# Patient Record
Sex: Female | Born: 1960 | Race: White | Hispanic: No | Marital: Married | Smoking: Never smoker
Health system: Southern US, Community
[De-identification: ages and names within clinical notes are randomized; demographics above are authoritative.]

## PROBLEM LIST (undated history)

## (undated) DIAGNOSIS — J45909 Unspecified asthma, uncomplicated: Secondary | ICD-10-CM

## (undated) DIAGNOSIS — I10 Essential (primary) hypertension: Secondary | ICD-10-CM

---

## 2006-01-03 ENCOUNTER — Ambulatory Visit: Payer: Self-pay

## 2007-01-07 ENCOUNTER — Ambulatory Visit: Payer: Self-pay | Admitting: Obstetrics and Gynecology

## 2007-01-21 ENCOUNTER — Ambulatory Visit: Payer: Self-pay | Admitting: Obstetrics and Gynecology

## 2008-08-30 ENCOUNTER — Inpatient Hospital Stay: Payer: Self-pay | Admitting: Internal Medicine

## 2011-02-19 ENCOUNTER — Ambulatory Visit: Payer: Self-pay | Admitting: Family Medicine

## 2012-09-30 ENCOUNTER — Ambulatory Visit: Payer: Self-pay | Admitting: Family Medicine

## 2014-11-23 ENCOUNTER — Ambulatory Visit: Payer: Self-pay | Admitting: Family Medicine

## 2014-12-12 ENCOUNTER — Ambulatory Visit: Payer: Self-pay | Admitting: Family Medicine

## 2015-05-23 ENCOUNTER — Emergency Department
Admission: EM | Admit: 2015-05-23 | Discharge: 2015-05-23 | Disposition: A | Discharge status: Home / Self Care | Payer: 59 | Attending: Emergency Medicine | Admitting: Emergency Medicine

## 2015-05-23 ENCOUNTER — Encounter: Payer: Self-pay | Admitting: Emergency Medicine

## 2015-05-23 ENCOUNTER — Emergency Department: Payer: 59

## 2015-05-23 DIAGNOSIS — D72829 Elevated white blood cell count, unspecified: Secondary | ICD-10-CM | POA: Diagnosis not present

## 2015-05-23 DIAGNOSIS — N12 Tubulo-interstitial nephritis, not specified as acute or chronic: Secondary | ICD-10-CM | POA: Diagnosis not present

## 2015-05-23 DIAGNOSIS — R109 Unspecified abdominal pain: Secondary | ICD-10-CM

## 2015-05-23 DIAGNOSIS — R799 Abnormal finding of blood chemistry, unspecified: Secondary | ICD-10-CM | POA: Diagnosis present

## 2015-05-23 HISTORY — DX: Unspecified asthma, uncomplicated: J45.909

## 2015-05-23 HISTORY — DX: Essential (primary) hypertension: I10

## 2015-05-23 MED ORDER — IOHEXOL 240 MG/ML SOLN
25.0000 mL | Freq: Once | INTRAMUSCULAR | Status: AC | PRN
  Administered 2015-05-23: 25 mL via ORAL

## 2015-05-23 MED ORDER — IOHEXOL 300 MG/ML  SOLN
100.0000 mL | Freq: Once | INTRAMUSCULAR | Status: AC | PRN
  Administered 2015-05-23: 100 mL via INTRAVENOUS

## 2015-05-23 MED ORDER — CEPHALEXIN 500 MG PO CAPS
500.0000 mg | ORAL_CAPSULE | Freq: Four times a day (QID) | ORAL | Status: AC
Start: 2015-05-23 — End: 2015-06-02

## 2015-05-23 MED ORDER — POTASSIUM CHLORIDE CRYS ER 20 MEQ PO TBCR
40.0000 meq | EXTENDED_RELEASE_TABLET | Freq: Once | ORAL | Status: AC
  Administered 2015-05-23: 40 meq via ORAL
  Filled 2015-05-23: qty 2

## 2015-05-23 MED ORDER — DEXTROSE 5 % IV SOLN
1.0000 g | Freq: Once | INTRAVENOUS | Status: AC
  Administered 2015-05-23: 1 g via INTRAVENOUS
  Filled 2015-05-23: qty 10

## 2015-05-23 MED ORDER — SODIUM CHLORIDE 0.9 % IV BOLUS (SEPSIS)
1000.0000 mL | Freq: Once | INTRAVENOUS | Status: AC
  Administered 2015-05-23: 1000 mL via INTRAVENOUS

## 2015-05-23 NOTE — ED Provider Notes (Signed)
[facility] Emergency Department Provider Note    ____________________________________________  Time seen: 1430  I have reviewed the triage vital signs and the nursing notes.   HISTORY  Chief Complaint Abnormal Lab   History limited by: Not Limited   HPI Maria Poole is a 54 y.o. female who presents to the emergency department today because of concern for worsening urinary tract infection. The patient states that she has been having suprapubic pain for the past 3-4 days. She started on ciprofloxacin 2 days ago for a urinary tract infection. When she went back to clinic today on recheck they noticed that her potassium was lower and still have concern for urinary tract infection. Additionally the patient states that her symptoms of fever or nausea and fatigue have worsened.     No past medical history on file.  There are no active problems to display for this patient.   No past surgical history on file.  No current outpatient prescriptions on file.  Allergies Review of patient's allergies indicates no known allergies.  No family history on file.  Social History History  Substance Use Topics  . Smoking status: Never Smoker   . Smokeless tobacco: Not on file  . Alcohol Use: No    Review of Systems  Constitutional: Positive for fever Cardiovascular: Negative for chest pain. Respiratory: Negative for shortness of breath. Gastrointestinal: Positive for lower abdominal pain and nausea Genitourinary: Increased difficulty with urination Musculoskeletal: Positive for low back pain Skin: Negative for rash. Neurological: Negative for headaches, focal weakness or numbness.  10-point ROS otherwise negative.  ____________________________________________   PHYSICAL EXAM:  VITAL SIGNS: ED Triage Vitals  Enc Vitals Group     BP 05/23/15 1309 115/71 mmHg     Pulse Rate 05/23/15 1309 79     Resp 05/23/15 1309 18     Temp 05/23/15 1309  98.7 F (37.1 C)     Temp Source 05/23/15 1309 Oral     SpO2 05/23/15 1309 96 %     Weight 05/23/15 1309 188 lb (85.276 kg)     Height 05/23/15 1309 5\' 5"  (1.651 m)     Head Cir --      Peak Flow --      Pain Score 05/23/15 1424 4   Constitutional: Alert and oriented. Well appearing and in no distress. Eyes: Conjunctivae are normal. PERRL. Normal extraocular movements. ENT   Head: Normocephalic and atraumatic.   Nose: No congestion/rhinnorhea.   Mouth/Throat: Mucous membranes are moist.   Neck: No stridor. Hematological/Lymphatic/Immunilogical: No cervical lymphadenopathy. Cardiovascular: Normal rate, regular rhythm.  No murmurs, rubs, or gallops. Respiratory: Normal respiratory effort without tachypnea nor retractions. Breath sounds are clear and equal bilaterally. No wheezes/rales/rhonchi. Gastrointestinal: Soft, tender to palpation in the lower abdomen. No rebound no guarding. No distention. There is no CVA tenderness. Genitourinary: Deferred Musculoskeletal: Normal range of motion in all extremities. No joint effusions.  No lower extremity tenderness nor edema. Neurologic:  Normal speech and language. No gross focal neurologic deficits are appreciated. Speech is normal.  Skin:  Skin is warm, dry and intact. No rash noted. Psychiatric: Mood and affect are normal. Speech and behavior are normal. Patient exhibits appropriate insight and judgment.  ____________________________________________    LABS (pertinent positives/negatives)  Labs reviewed from [facility] clinic ____________________________________________   EKG  None  ____________________________________________    RADIOLOGY  CT abd/pel pending at time of signout  ____________________________________________   PROCEDURES  Procedure(s) performed: None  Critical Care performed: No  ____________________________________________   INITIAL IMPRESSION / ASSESSMENT AND PLAN / ED COURSE  Pertinent  labs & imaging results that were available during my care of the patient were reviewed by me and considered in my medical decision making (see chart for details).  Patient presents to the emergency department today with concerns for worsening urinary tract infection. Blood work from [facility] note a clinic shows a significant leukocytosis. Patient is tender in the lower abdomen. No CVA tenderness for me so at this point I doubt pyelonephritis. Will have her proceed with a CT abdomen and pelvis to for intra-abdominal pathology.  ____________________________________________   FINAL CLINICAL IMPRESSION(S) / ED DIAGNOSES  Abdominal pain Leukocytosis  Phineas Semen, MD 05/23/15 1447

## 2015-05-23 NOTE — ED Provider Notes (Signed)
CT returned showing hypodensities in both kidneys. There is no other pathology noted. This is most consistent with bilateral pyelonephritis. Patient is not vomiting does not have a fever at this time. She does have a very high white count and white cells in the urine. Per discussion with Dr. Derrill Kay will try Keflex by mouth 504 times a day. Told patient and her husband A not to go to work B return if she becomes worse high fever or worse pain and vomiting and unable to keep down the medicine or feeling sicker C she is to follow-up within 2 days either here or with her clinic to make sure she is getting better.  Arnaldo Natal, MD 05/23/15 [phone]

## 2015-05-23 NOTE — ED Notes (Signed)
Patient to ED from [facility] with report of worsening kidney infection. Patient completed antibiotics but continues to have worsening symptoms as well as lab values. Patient came with lab results that were completed today. Patient c/o lower abdominal pain.

## 2015-05-25 LAB — URINE CULTURE
CULTURE: NO GROWTH
Special Requests: NORMAL

## 2016-07-12 IMAGING — CT CT ABD-PELV W/ CM
1 of 3 series · 14 of 32 positions shown, 19 images · IV contrast (omnipaque)
Comparison: Report from 08/30/2008 CT.

CLINICAL DATA: 54-year-old female with worsening kidney infection.
Lower abdominal pain for the past week. Prior appendectomy. History
of hypertension. Initial encounter.

EXAM:
CT ABDOMEN AND PELVIS WITH CONTRAST
TECHNIQUE: Multidetector CT imaging of the abdomen and pelvis was performed
using the standard protocol following bolus administration of
intravenous contrast.
CONTRAST:  100mL OMNIPAQUE IOHEXOL 300 MG/ML  SOLN

[Series 2: routine abd pel with · axial · 0.80mm/px · z∈[-403,+7]mm · 14 of 92 slices shown, 19 images]
[im 5/92  soft-tissue]
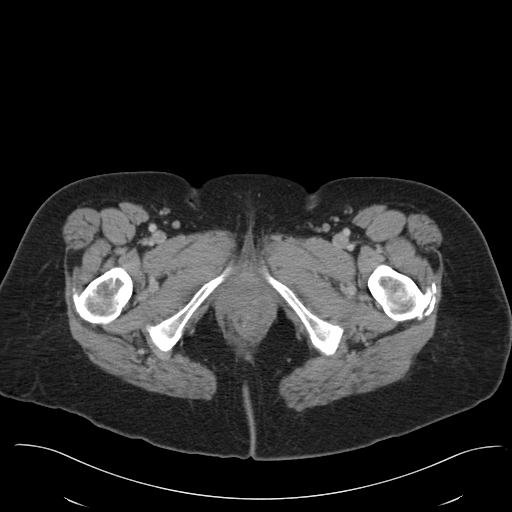
[im 5/92  bone]
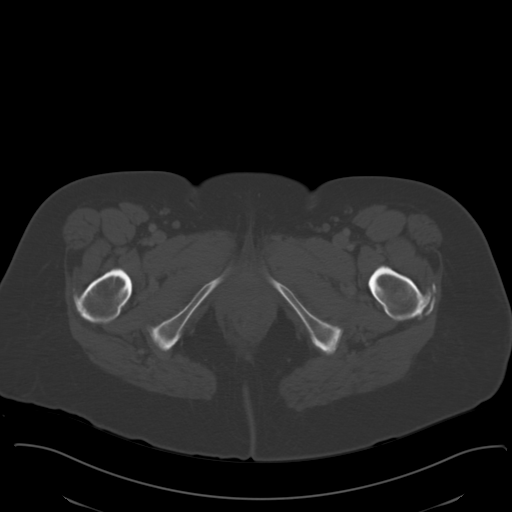
[im 15/92  soft-tissue]
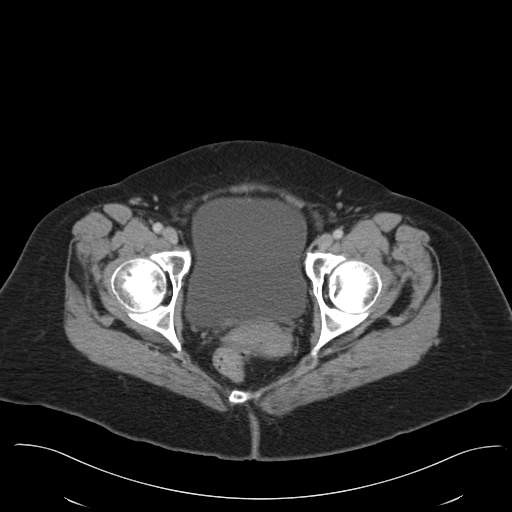
[im 20/92  soft-tissue]
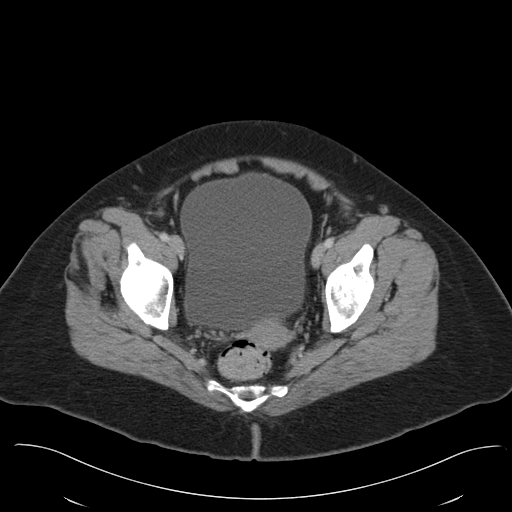
[im 24/92  soft-tissue]
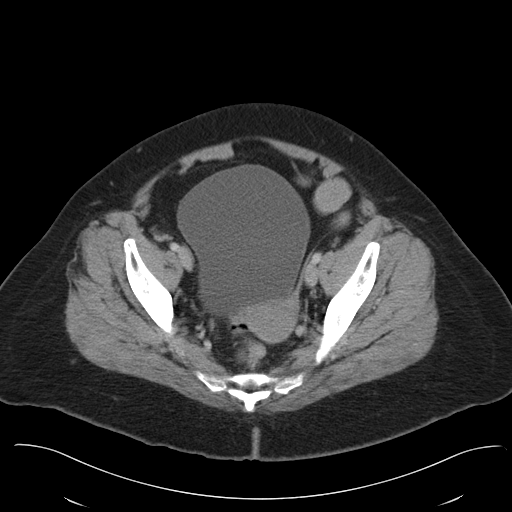
[im 34/92  soft-tissue]
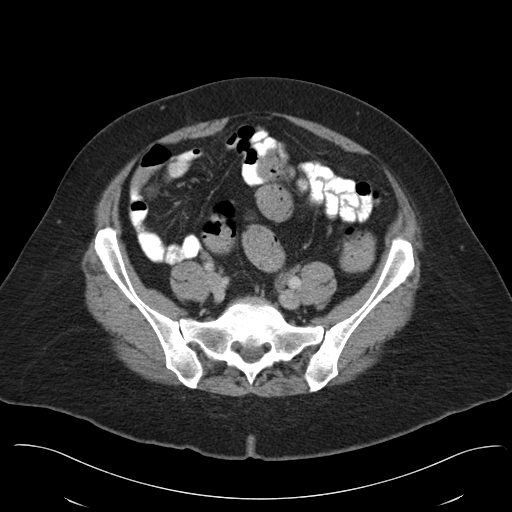
[im 39/92  soft-tissue]
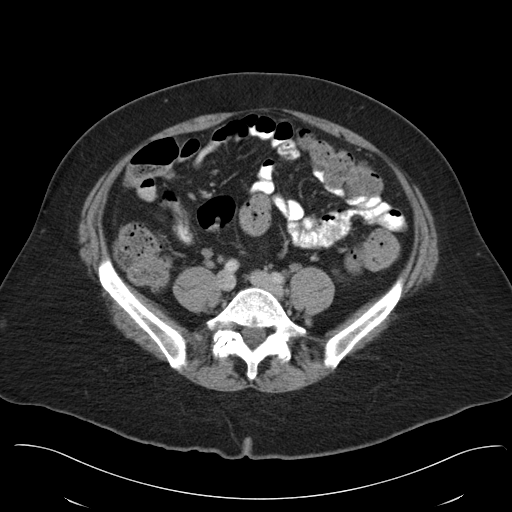
[im 48/92  soft-tissue]
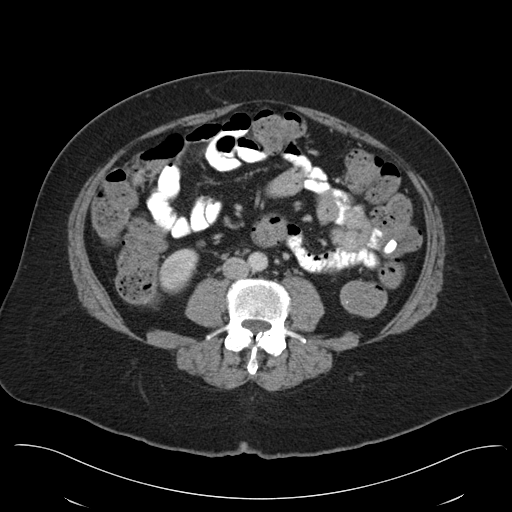
[im 53/92  soft-tissue]
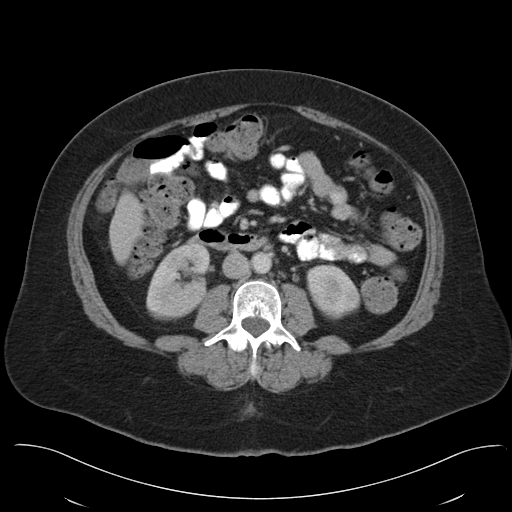
[im 58/92  soft-tissue]
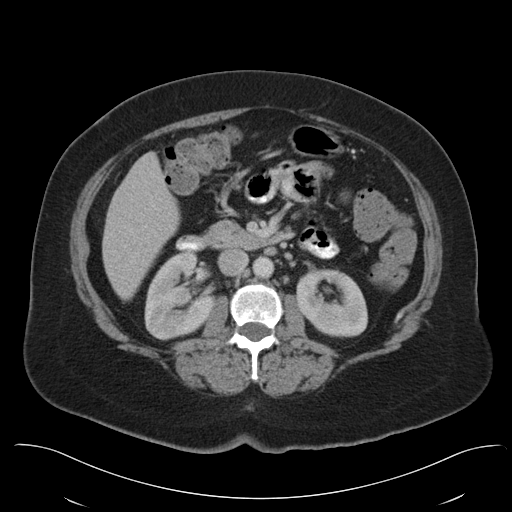
[im 58/92  bone]
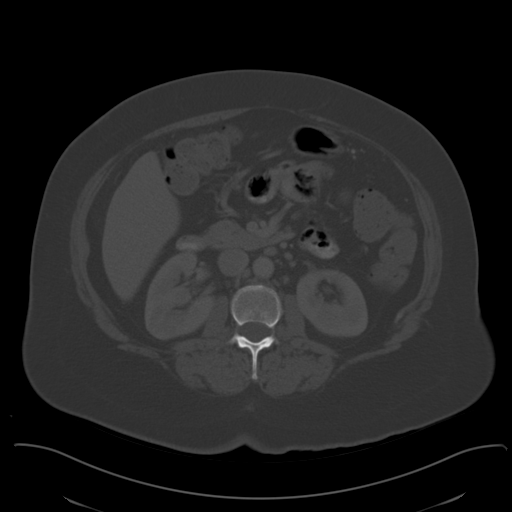
[im 68/92  soft-tissue]
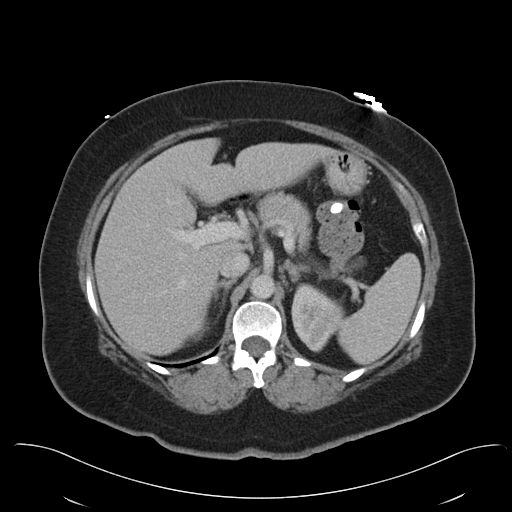
[im 72/92  soft-tissue]
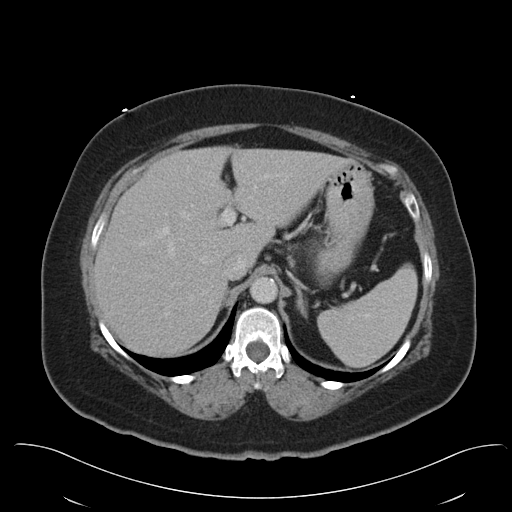
[im 72/92  lung]
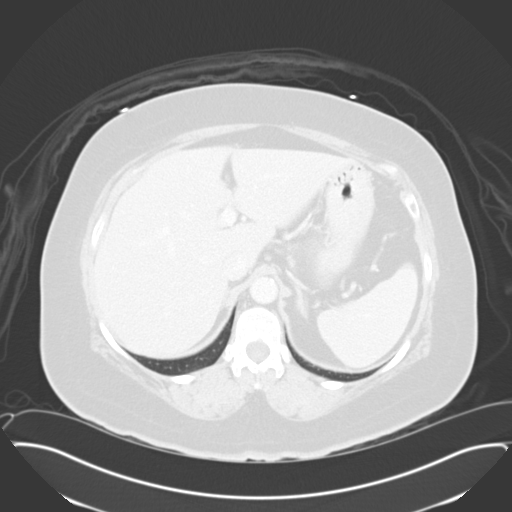
[im 77/92  soft-tissue]
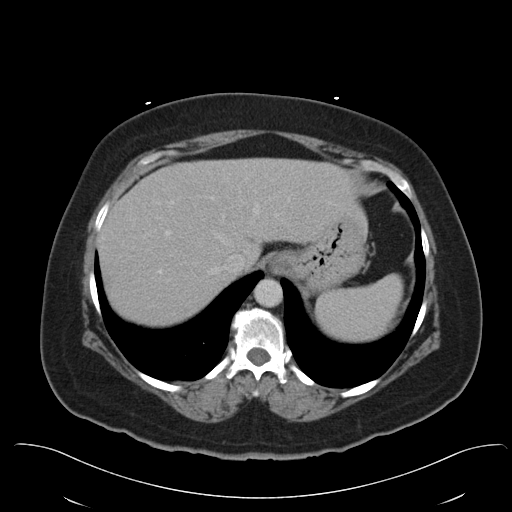
[im 77/92  lung]
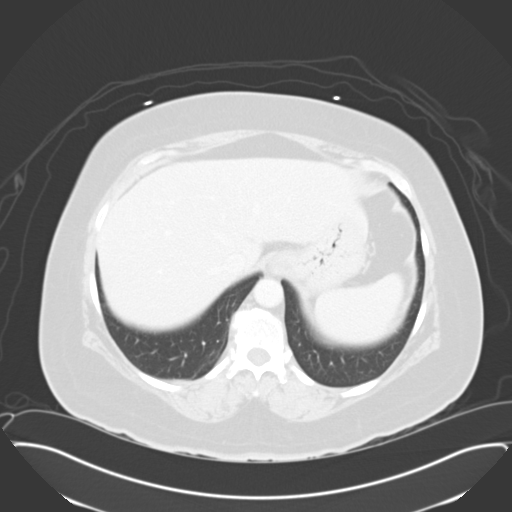
[im 82/92  lung]
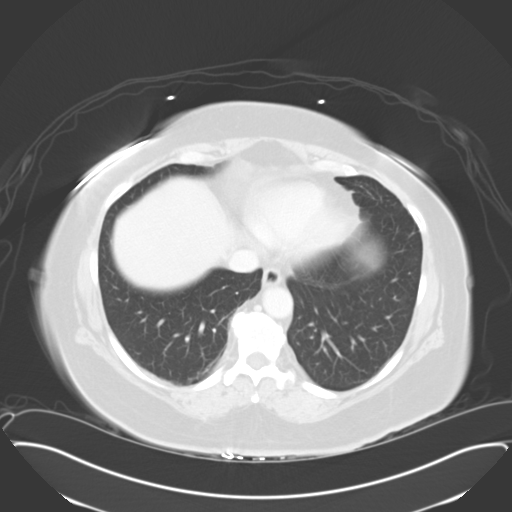
[im 87/92  soft-tissue]
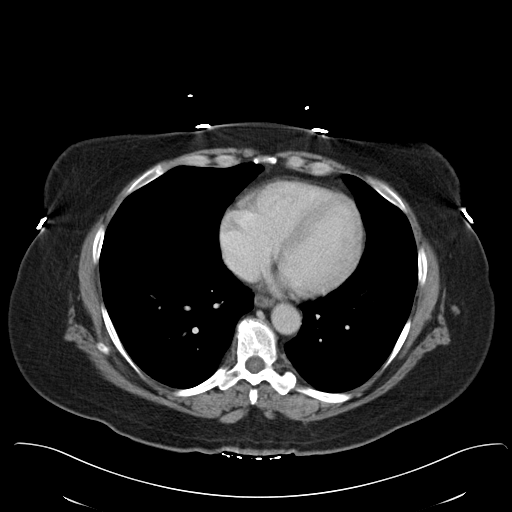
[im 87/92  lung]
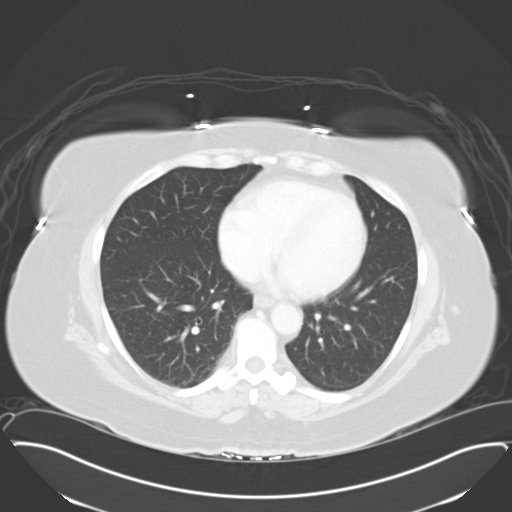

[14 of 32 positions shown; findings below may reference images not displayed]

FINDINGS: Lung bases clear.

Prominent bilateral low-density regions throughout the kidneys
greater on the right consistent with patient's history of
pyelonephritis currently without well-defined drainable abscess. No
hydronephrosis.

Increased number of normal to top-normal size retroperitoneal lymph
nodes may be reactive in origin.

No extra luminal bowel inflammatory process, free fluid or free air.

No worrisome hepatic, splenic, adrenal or pancreatic lesion. No
calcified gallstones.

Trace calcification abdominal aorta.  No abdominal aortic aneurysm.

Urinary bladder significantly enlarged without gross abnormality
noted.

Uterus and ovaries without gross abnormality.

Mild degenerative changes lumbar spine. No worrisome osseous lesion.
IMPRESSION: Prominent bilateral low-density regions throughout the kidneys
greater on the right consistent with patient's history of
pyelonephritis. Currently without well-defined drainable abscess. No
hydronephrosis.

Increased number of normal to top-normal size retroperitoneal lymph
nodes may be reactive in origin.

## 2018-11-13 ENCOUNTER — Other Ambulatory Visit: Payer: Self-pay | Admitting: Podiatry

## 2018-11-13 ENCOUNTER — Ambulatory Visit (INDEPENDENT_AMBULATORY_CARE_PROVIDER_SITE_OTHER): Payer: BC Managed Care – PPO

## 2018-11-13 ENCOUNTER — Ambulatory Visit: Payer: BC Managed Care – PPO | Admitting: Podiatry

## 2018-11-13 ENCOUNTER — Encounter: Payer: Self-pay | Admitting: Podiatry

## 2018-11-13 VITALS — BP 139/78 | HR 71

## 2018-11-13 DIAGNOSIS — F32A Depression, unspecified: Secondary | ICD-10-CM | POA: Insufficient documentation

## 2018-11-13 DIAGNOSIS — F329 Major depressive disorder, single episode, unspecified: Secondary | ICD-10-CM | POA: Insufficient documentation

## 2018-11-13 DIAGNOSIS — M21612 Bunion of left foot: Secondary | ICD-10-CM

## 2018-11-13 DIAGNOSIS — M76821 Posterior tibial tendinitis, right leg: Secondary | ICD-10-CM | POA: Diagnosis not present

## 2018-11-13 DIAGNOSIS — M2042 Other hammer toe(s) (acquired), left foot: Secondary | ICD-10-CM

## 2018-11-13 DIAGNOSIS — M21611 Bunion of right foot: Secondary | ICD-10-CM

## 2018-11-13 DIAGNOSIS — J45909 Unspecified asthma, uncomplicated: Secondary | ICD-10-CM | POA: Insufficient documentation

## 2018-11-13 DIAGNOSIS — M2041 Other hammer toe(s) (acquired), right foot: Secondary | ICD-10-CM

## 2018-11-13 DIAGNOSIS — M19079 Primary osteoarthritis, unspecified ankle and foot: Secondary | ICD-10-CM

## 2018-11-13 DIAGNOSIS — M2142 Flat foot [pes planus] (acquired), left foot: Secondary | ICD-10-CM

## 2018-11-13 DIAGNOSIS — M2141 Flat foot [pes planus] (acquired), right foot: Secondary | ICD-10-CM

## 2018-11-13 MED ORDER — CELECOXIB 200 MG PO CAPS: 200.0000 mg | ORAL_CAPSULE | Freq: Two times a day (BID) | ORAL | 2 refills | Status: DC

## 2018-11-17 NOTE — Progress Notes (Signed)
   HPI: 58 year old female presents the office today for bilateral foot pain.  Patient states that her feet hurt all of the time.  She has orthotics she may need new months.  She complains of diffuse foot and ankle pain along the inside and outside the foot.  She has sharp shooting stabbing pains in both of her feet and sides of the ankles.  She has been using tea tree oil on her nails but she is concerned about fungus.  Patient is currently taking meloxicam prescribed by another physician daily but there is been no improvement of the patient's symptoms.  There is been no improvement of the patient's symptoms.  Past Medical History:  Diagnosis Date  . Asthma   . Hypertension      Physical Exam: General: The patient is alert and oriented x3 in no acute distress.  Dermatology: Skin is warm, dry and supple bilateral lower extremities. Negative for open lesions or macerations.  Vascular: Palpable pedal pulses bilaterally. No edema or erythema noted. Capillary refill within normal limits.  Neurological: Epicritic and protective threshold grossly intact bilaterally.   Musculoskeletal Exam: Range of motion within normal limits to all pedal and ankle joints bilateral. Muscle strength 5/5 in all groups bilateral.  Pain on palpation along the posterior tibial tendon insertion right lower extremity.  Clinical evidence of bunion hammertoe deformities bilateral.  There is also some pain on palpation diffusely throughout the foot and ankle bilateral  Radiographic Exam:  Normal osseous mineralization.  There is some diffuse degenerative changes and joint space narrowing within the pedal joints of the feet bilateral.  Increased intermetatarsal angle greater than 15 degrees of the hallux abductus angle greater than 30 degrees consistent with bunions bilateral.  Hammertoe contracture also noted to digits 2-5 bilateral.  Collapse of the calcaneal inclination angle and metatarsal declination angle noted consistent  with a pes planus bilateral.  Assessment: 1.  Insertional posterior tibial tendinitis right 2.  Hallux abductovalgus bilateral 3.  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  Pes planus bilateral 5.  DJD bilateral feet   Plan of Care:  1. Patient evaluated. X-Rays reviewed.  2.  Injection of 0.5 cc Celestone Soluspan injected into the insertional sheath of the posterior tibial tendon right 3.  Discontinue meloxicam.  Prescription for Celebrex 200 mg twice daily 4.  Appointment made today with Raiford Noble, Pedorthist, for custom molded insoles 5.  Recommend good supportive sneakers 6.  Return to clinic in 8 weeks  *[place] [facility]      Felecia Shelling, DPM [facility]  Dr. Felecia Shelling, DPM    [address] [place], [place] [address]                Office [phone]  Fax [phone]

## 2018-11-25 ENCOUNTER — Ambulatory Visit (INDEPENDENT_AMBULATORY_CARE_PROVIDER_SITE_OTHER): Payer: BC Managed Care – PPO | Admitting: Orthotics

## 2018-11-25 DIAGNOSIS — M2141 Flat foot [pes planus] (acquired), right foot: Secondary | ICD-10-CM | POA: Diagnosis not present

## 2018-11-25 DIAGNOSIS — M2142 Flat foot [pes planus] (acquired), left foot: Secondary | ICD-10-CM

## 2018-11-25 DIAGNOSIS — M21611 Bunion of right foot: Secondary | ICD-10-CM

## 2018-11-25 DIAGNOSIS — M21612 Bunion of left foot: Secondary | ICD-10-CM

## 2018-11-25 DIAGNOSIS — M76821 Posterior tibial tendinitis, right leg: Secondary | ICD-10-CM

## 2018-11-25 NOTE — Progress Notes (Signed)
Patient presents today with a hx of PTTD/AAF.  Upon assessment, patient has pronounced pes planus w/ a valgus RF deformity.  Patient has medially shifted talus/navicular.  Goal is provide longitudinal arch support and RF stability.  Plan on deep heel cup, hug arch, wide foot orthosis w/ medial flange and varus correction for RF valgus deformity.  Patient educated in the progessive nature of PTTD and financial responsibility.  

## 2018-12-16 ENCOUNTER — Ambulatory Visit: Payer: BC Managed Care – PPO | Admitting: Orthotics

## 2018-12-16 DIAGNOSIS — M21612 Bunion of left foot: Secondary | ICD-10-CM

## 2018-12-16 DIAGNOSIS — M21611 Bunion of right foot: Secondary | ICD-10-CM

## 2018-12-16 DIAGNOSIS — M76821 Posterior tibial tendinitis, right leg: Secondary | ICD-10-CM

## 2018-12-16 DIAGNOSIS — M2142 Flat foot [pes planus] (acquired), left foot: Secondary | ICD-10-CM

## 2018-12-16 DIAGNOSIS — M2141 Flat foot [pes planus] (acquired), right foot: Secondary | ICD-10-CM

## 2018-12-18 NOTE — Progress Notes (Signed)
Patient came in today to pick up custom made foot orthotics.  The goals were accomplished and the patient reported no dissatisfaction with said orthotics.  Patient was advised of breakin period and how to report any issues. 

## 2019-01-08 ENCOUNTER — Ambulatory Visit: Payer: BC Managed Care – PPO | Admitting: Podiatry

## 2019-01-08 ENCOUNTER — Other Ambulatory Visit: Payer: Self-pay

## 2019-02-22 ENCOUNTER — Other Ambulatory Visit: Payer: Self-pay

## 2019-02-22 MED ORDER — CELECOXIB 200 MG PO CAPS: 200.0000 mg | ORAL_CAPSULE | Freq: Two times a day (BID) | ORAL | 2 refills | Status: DC

## 2019-02-22 NOTE — Telephone Encounter (Signed)
Pharmacy refill request for Celebrex 200mg    Per Dr. Logan Bores verbal order, ok to refill.   Script has been sent to pharmacy.

## 2019-04-27 ENCOUNTER — Encounter: Payer: Self-pay | Admitting: Podiatry

## 2019-04-27 ENCOUNTER — Other Ambulatory Visit: Payer: Self-pay

## 2019-04-27 ENCOUNTER — Ambulatory Visit: Payer: BC Managed Care – PPO | Admitting: Podiatry

## 2019-04-27 VITALS — Temp 98.0°F

## 2019-04-27 DIAGNOSIS — M21611 Bunion of right foot: Secondary | ICD-10-CM | POA: Diagnosis not present

## 2019-04-27 DIAGNOSIS — M21612 Bunion of left foot: Secondary | ICD-10-CM

## 2019-04-27 DIAGNOSIS — M76821 Posterior tibial tendinitis, right leg: Secondary | ICD-10-CM

## 2019-04-27 DIAGNOSIS — M2142 Flat foot [pes planus] (acquired), left foot: Secondary | ICD-10-CM

## 2019-04-27 DIAGNOSIS — M2042 Other hammer toe(s) (acquired), left foot: Secondary | ICD-10-CM

## 2019-04-27 DIAGNOSIS — M2141 Flat foot [pes planus] (acquired), right foot: Secondary | ICD-10-CM | POA: Diagnosis not present

## 2019-04-27 DIAGNOSIS — M19079 Primary osteoarthritis, unspecified ankle and foot: Secondary | ICD-10-CM

## 2019-04-27 DIAGNOSIS — M2041 Other hammer toe(s) (acquired), right foot: Secondary | ICD-10-CM

## 2019-04-27 MED ORDER — METHYLPREDNISOLONE 4 MG PO TBPK: ORAL_TABLET | ORAL | 0 refills | Status: DC

## 2019-04-28 NOTE — Progress Notes (Signed)
   HPI: 58 year old female presents the office today for follow up evaluation of bilateral foot pain. She reports the pain from the PT tendinitis of the right foot flares up intermittently. Being on her feet for long periods of time increases the pain. She states the pain radiates up her leg when she stands for too long. She has been taking Celebrex for pain. Patient is here for further evaluation and treatment.   Past Medical History:  Diagnosis Date  . Asthma   . Hypertension      Physical Exam: General: The patient is alert and oriented x3 in no acute distress.  Dermatology: Skin is warm, dry and supple bilateral lower extremities. Negative for open lesions or macerations.  Vascular: Palpable pedal pulses bilaterally. No edema or erythema noted. Capillary refill within normal limits.  Neurological: Epicritic and protective threshold grossly intact bilaterally.   Musculoskeletal Exam: Range of motion within normal limits to all pedal and ankle joints bilateral. Muscle strength 5/5 in all groups bilateral.  Pain on palpation along the posterior tibial tendon insertion right lower extremity.  Clinical evidence of bunion hammertoe deformities bilateral.  There is also some pain on palpation diffusely throughout the foot and ankle bilateral  Assessment: 1.  Insertional posterior tibial tendinitis right 2.  Hallux abductovalgus bilateral 3.  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  Pes planus bilateral 5.  DJD bilateral feet   Plan of Care:  1. Patient evaluated.  2.  Injection of 0.5 cc Celestone Soluspan injected into the insertion of the posterior tibial tendon sheath of the right lower extremity.  3.  Prescription for Medrol Dose Pak provided to patient. Then continue taking Celebrex 200 mg twice daily.  4. Continue using custom orthotics.  5. Return to clinic as needed.   Artist at [place] M. Evans, DPM [facility]  Dr. Edrick Kins, DPM    [address], [place] [address]                Office [phone]  Fax [phone]

## 2019-05-18 ENCOUNTER — Other Ambulatory Visit: Payer: Self-pay | Admitting: Podiatry

## 2019-05-18 ENCOUNTER — Other Ambulatory Visit: Payer: Self-pay

## 2019-06-25 ENCOUNTER — Encounter: Payer: Self-pay | Admitting: Podiatry

## 2019-06-25 ENCOUNTER — Ambulatory Visit: Payer: BC Managed Care – PPO | Admitting: Podiatry

## 2019-06-25 ENCOUNTER — Other Ambulatory Visit: Payer: Self-pay

## 2019-06-25 DIAGNOSIS — M2141 Flat foot [pes planus] (acquired), right foot: Secondary | ICD-10-CM

## 2019-06-25 DIAGNOSIS — M76821 Posterior tibial tendinitis, right leg: Secondary | ICD-10-CM

## 2019-06-25 DIAGNOSIS — M2142 Flat foot [pes planus] (acquired), left foot: Secondary | ICD-10-CM

## 2019-06-25 MED ORDER — METHYLPREDNISOLONE 4 MG PO TBPK: ORAL_TABLET | ORAL | 0 refills | Status: AC

## 2019-06-28 NOTE — Progress Notes (Signed)
   HPI: 58 year old female presents the office today for follow up evaluation of bilateral foot pain. She reports continued pain and swelling in the right ankle and states it is worsening. She states the injection she had at the last appointment did not provided any relief. She has been taking Celebrex for treatment. Patient is here for further evaluation and treatment.   Past Medical History:  Diagnosis Date  . Asthma   . Hypertension      Physical Exam: General: The patient is alert and oriented x3 in no acute distress.  Dermatology: Skin is warm, dry and supple bilateral lower extremities. Negative for open lesions or macerations.  Vascular: Palpable pedal pulses bilaterally. No edema or erythema noted. Capillary refill within normal limits.  Neurological: Epicritic and protective threshold grossly intact bilaterally.   Musculoskeletal Exam: Range of motion within normal limits to all pedal and ankle joints bilateral. Muscle strength 5/5 in all groups bilateral.   Pain on palpation noted to the posterior tibial tendon of the right foot with medial longitudinal arch collapse noted. Clinical evidence of bunion hammertoe deformities bilateral.   There is also some pain on palpation diffusely throughout the foot and ankle bilateral  Assessment: 1. PTTD right  2. Hallux abductovalgus bilateral 3. 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 Pes planus bilateral 5. DJD bilateral feet   Plan of Care:  1. Patient evaluated.  2. Injection of 0.5 cc Celestone Soluspan injected into the insertion of the posterior tibial tendon sheath of the right lower extremity.  3. Prescription for Medrol Dose Pak provided to patient. Then continue taking Celebrex 200 mg.  4. Ankle brace dispensed.  5. Return to clinic in 4 weeks.    Artist at [place] M. Aarini Slee, DPM [facility]  Dr. Edrick Kins, DPM    [address], [place]  [address]                Office [phone]  Fax [phone]

## 2019-07-27 ENCOUNTER — Ambulatory Visit: Payer: BC Managed Care – PPO | Admitting: Podiatry

## 2019-07-27 ENCOUNTER — Other Ambulatory Visit: Payer: Self-pay

## 2019-07-27 DIAGNOSIS — M2141 Flat foot [pes planus] (acquired), right foot: Secondary | ICD-10-CM

## 2019-07-27 DIAGNOSIS — M2142 Flat foot [pes planus] (acquired), left foot: Secondary | ICD-10-CM

## 2019-07-27 DIAGNOSIS — M76821 Posterior tibial tendinitis, right leg: Secondary | ICD-10-CM

## 2019-07-27 NOTE — Progress Notes (Signed)
   HPI: 58 year old female presents the office today for follow up evaluation of bilateral foot pain. She states she shows some improvement. She has been using the ankle braces which have helped ease her pain. Taking the Celebrex has also helped alleviate the pain. There are no specific worsening factors noted. Patient is here for further evaluation and treatment.   Past Medical History:  Diagnosis Date  . Asthma   . Hypertension      Physical Exam: General: The patient is alert and oriented x3 in no acute distress.  Dermatology: Skin is warm, dry and supple bilateral lower extremities. Negative for open lesions or macerations.  Vascular: Palpable pedal pulses bilaterally. No edema or erythema noted. Capillary refill within normal limits.  Neurological: Epicritic and protective threshold grossly intact bilaterally.   Musculoskeletal Exam: Range of motion within normal limits to all pedal and ankle joints bilateral. Muscle strength 5/5 in all groups bilateral.   Pain on palpation noted to the posterior tibial tendon of the right foot with medial longitudinal arch collapse noted. Clinical evidence of bunion hammertoe deformities bilateral.   There is also some pain on palpation diffusely throughout the foot and ankle bilateral  Assessment: 1. PTTD right  2. Hallux abductovalgus bilateral 3. 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 Pes planus bilateral 5. DJD bilateral feet   Plan of Care:  1. Patient evaluated.  2. Injection of 0.5 cc Celestone Soluspan injected into the insertion of the posterior tibial tendon sheath of the right lower extremity.  3. Continue taking Celebrex 200 mg BID.  4. Continue using custom orthotics.  5. Continue using OTC ankle brace.  6. Return to clinic in 4 weeks. If not better, we will order an MRI.    Artist at [place] M. Evans, DPM [facility]  Dr. Edrick Kins, DPM    [address], [place] [address]                Office [phone]  Fax [phone]

## 2019-08-24 ENCOUNTER — Encounter: Payer: Self-pay | Admitting: Podiatry

## 2019-08-24 ENCOUNTER — Ambulatory Visit: Payer: BC Managed Care – PPO | Admitting: Podiatry

## 2019-08-24 ENCOUNTER — Other Ambulatory Visit: Payer: Self-pay

## 2019-08-24 DIAGNOSIS — M2141 Flat foot [pes planus] (acquired), right foot: Secondary | ICD-10-CM

## 2019-08-24 DIAGNOSIS — M76821 Posterior tibial tendinitis, right leg: Secondary | ICD-10-CM | POA: Diagnosis not present

## 2019-08-24 DIAGNOSIS — M2142 Flat foot [pes planus] (acquired), left foot: Secondary | ICD-10-CM

## 2019-08-26 NOTE — Progress Notes (Signed)
   HPI: 58 year old female presents the office today for follow up evaluation of bilateral foot pain. She states her pain in the right foot has worsened. She has been taking Ibuprofen with no significant relief. Walking and being on the foot increases the pain. Patient is here for further evaluation and treatment.   Past Medical History:  Diagnosis Date  . Asthma   . Hypertension      Physical Exam: General: The patient is alert and oriented x3 in no acute distress.  Dermatology: Skin is warm, dry and supple bilateral lower extremities. Negative for open lesions or macerations.  Vascular: Palpable pedal pulses bilaterally. No edema or erythema noted. Capillary refill within normal limits.  Neurological: Epicritic and protective threshold grossly intact bilaterally.   Musculoskeletal Exam: Range of motion within normal limits to all pedal and ankle joints bilateral. Muscle strength 5/5 in all groups bilateral.   Pain on palpation noted to the posterior tibial tendon of the right foot with medial longitudinal arch collapse noted.  Clinical evidence of bunion hammertoe deformities bilateral.   There is also some pain on palpation diffusely throughout the foot and ankle bilateral  Assessment: 1. PTTD right  2. Hallux abductovalgus bilateral 3. 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 Pes planus bilateral 5. DJD bilateral feet 6. PT tendinitis right    Plan of Care:  1. Patient evaluated.  2. Injection of 0.5 cc Celestone Soluspan injected into the insertion of the posterior tibial tendon sheath of the right lower extremity.  3. Continue taking Celebrex 200 mg BID.  4. MRI of the right ankle ordered.  5. CAM boot dispensed.  6. Return to clinic after MRI to review results.    Artist at [place] M. Carrol Bondar, DPM [facility]  Dr. Edrick Kins, DPM    [address], [place] [address]                Office [phone]  Fax [phone]

## 2019-08-30 ENCOUNTER — Telehealth: Payer: Self-pay

## 2019-08-30 DIAGNOSIS — M76821 Posterior tibial tendinitis, right leg: Secondary | ICD-10-CM

## 2019-08-30 NOTE — Telephone Encounter (Signed)
MRI has been approved from 08/30/2019 to 02/25/2020 Auth # [id number]  Patient has been notified of approval and will call scheduling to set up appt to her convenience

## 2019-08-30 NOTE — Telephone Encounter (Signed)
-----   Message from Edrick Kins, DPM sent at 08/24/2019 10:28 AM EDT ----- Regarding: MRI right ankle Please order MRI right ankle w/out contrast.   Dx: PTTD RT. Possible PT tendon tear RT  Thanks, Dr. Amalia Hailey

## 2019-09-09 ENCOUNTER — Ambulatory Visit: Payer: BC Managed Care – PPO

## 2019-09-17 ENCOUNTER — Ambulatory Visit: Payer: BC Managed Care – PPO | Admitting: Podiatry

## 2019-09-20 ENCOUNTER — Other Ambulatory Visit: Payer: Self-pay

## 2019-09-20 ENCOUNTER — Ambulatory Visit
Admission: RE | Admit: 2019-09-20 | Discharge: 2019-09-20 | Disposition: A | Discharge status: Home / Self Care | Payer: BC Managed Care – PPO | Source: Ambulatory Visit | Attending: Podiatry | Admitting: Podiatry

## 2019-09-20 DIAGNOSIS — M76821 Posterior tibial tendinitis, right leg: Secondary | ICD-10-CM | POA: Diagnosis present

## 2019-09-28 ENCOUNTER — Ambulatory Visit: Payer: BC Managed Care – PPO | Admitting: Podiatry

## 2019-09-28 ENCOUNTER — Other Ambulatory Visit: Payer: Self-pay

## 2019-09-28 DIAGNOSIS — M76821 Posterior tibial tendinitis, right leg: Secondary | ICD-10-CM | POA: Diagnosis not present

## 2019-09-28 DIAGNOSIS — M2142 Flat foot [pes planus] (acquired), left foot: Secondary | ICD-10-CM | POA: Diagnosis not present

## 2019-09-28 DIAGNOSIS — M19079 Primary osteoarthritis, unspecified ankle and foot: Secondary | ICD-10-CM

## 2019-09-28 DIAGNOSIS — M2141 Flat foot [pes planus] (acquired), right foot: Secondary | ICD-10-CM

## 2019-09-28 NOTE — Patient Instructions (Signed)
Pre-Operative Instructions  Congratulations, you have decided to take an important step towards improving your quality of life.  You can be assured that the doctors and staff at [facility] will be with you every step of the way.  Here are some important things you should know:  1. Plan to be at the surgery center/hospital at least 1 (one) hour prior to your scheduled time, unless otherwise directed by the surgical center/hospital staff.  You must have a responsible adult accompany you, remain during the surgery and drive you home.  Make sure you have directions to the surgical center/hospital to ensure you arrive on time. 2. If you are having surgery at [facility] or [facility] hospitals, you will need a copy of your medical history and physical form from your family physician within one month prior to the date of surgery. We will give you a form for your primary physician to complete.  3. We make every effort to accommodate the date you request for surgery.  However, there are times where surgery dates or times have to be moved.  We will contact you as soon as possible if a change in schedule is required.   4. No aspirin/ibuprofen for one week before surgery.  If you are on aspirin, any non-steroidal anti-inflammatory medications (Mobic, Aleve, Ibuprofen) should not be taken seven (7) days prior to your surgery.  You make take Tylenol for pain prior to surgery.  5. Medications - If you are taking daily heart and blood pressure medications, seizure, reflux, allergy, asthma, anxiety, pain or diabetes medications, make sure you notify the surgery center/hospital before the day of surgery so they can tell you which medications you should take or avoid the day of surgery. 6. No food or drink after midnight the night before surgery unless directed otherwise by surgical center/hospital staff. 7. No alcoholic beverages 24-hours prior to surgery.  No smoking 24-hours prior or 24-hours after  surgery. 8. Wear loose pants or shorts. They should be loose enough to fit over bandages, boots, and casts. 9. Don't wear slip-on shoes. Sneakers are preferred. 10. Bring your boot with you to the surgery center/hospital.  Also bring crutches or a walker if your physician has prescribed it for you.  If you do not have this equipment, it will be provided for you after surgery. 11. If you have not been contacted by the surgery center/hospital by the day before your surgery, call to confirm the date and time of your surgery. 12. Leave-time from work may vary depending on the type of surgery you have.  Appropriate arrangements should be made prior to surgery with your employer. 13. Prescriptions will be provided immediately following surgery by your doctor.  Fill these as soon as possible after surgery and take the medication as directed. Pain medications will not be refilled on weekends and must be approved by the doctor. 14. Remove nail polish on the operative foot and avoid getting pedicures prior to surgery. 15. Wash the night before surgery.  The night before surgery wash the foot and leg well with water and the antibacterial soap provided. Be sure to pay special attention to beneath the toenails and in between the toes.  Wash for at least three (3) minutes. Rinse thoroughly with water and dry well with a towel.  Perform this wash unless told not to do so by your physician.  Enclosed: 1 Ice pack (please put in freezer the night before surgery)   1 Hibiclens skin cleaner     Pre-op instructions  If you have any questions regarding the instructions, please do not hesitate to call our office.  [place]: [address], [place], [place] [address] -- [phone]  [place]: [address] [place], [place] [address] -- [phone]  [place]: [address]  [place], [place] [address] -- [phone]   Website: [email or web address] 

## 2019-09-29 ENCOUNTER — Telehealth: Payer: Self-pay | Admitting: *Deleted

## 2019-09-29 NOTE — Telephone Encounter (Signed)
"  I am calling to schedule my surgery.  I saw Dr. Amalia Hailey yesterday.  They told me to give you a call."  Do you have a date that you like?  "I can do whatever his next available date is."  He can do your surgery on October 28, 2019.  "I'll take it.  What do I need to do and what time do I need to get there?"  You need to register online via the surgical center's [facility] Portal.  The instructions are in the brochure that we gave you.  "I'm technology challenged."  If you can't do it online, someone from the surgical center will call you to get the information they need.  Someone from the surgical center will call you a day or two prior to your surgery date and will you your arrival time.  The reason they do it at that time is because they may have cancellations or they may have children or Diabetic patients that they like to do first.  "I'm Diabetic."  Then your surgery will probably be sometime that morning.

## 2019-09-29 NOTE — Progress Notes (Signed)
   HPI: 58 y.o. female presenting today for follow-up evaluation regarding continued pain and tenderness to the right foot.  MRI was ordered on last visit and she presents today to review the MRI results.  No new complaints at this time  Past Medical History:  Diagnosis Date  . Asthma   . Hypertension      Physical Exam: General: The patient is alert and oriented x3 in no acute distress.  Dermatology: Skin is warm, dry and supple bilateral lower extremities. Negative for open lesions or macerations.  Vascular: Palpable pedal pulses bilaterally. No edema or erythema noted. Capillary refill within normal limits.  Neurological: Epicritic and protective threshold grossly intact bilaterally.   Musculoskeletal Exam: Range of motion within normal limits to all pedal and ankle joints bilateral. Muscle strength 5/5 in all groups bilateral.   Radiographic Exam:  Based on prior radiographic exam there is some degenerative changes with dorsal spurring and joint space narrowing noted to the TN joint.  There is also some DJD and arthritic changes noted to the posterior aspect of the posterior facet of the subtalar joint.  MRI impression 09/21/2019: 1. Complete tear of the infra-malleolar portion of the distal posterior tibialis tendon at its insertion on the navicular. The more proximal tendon is markedly tendinotic with partial tearing and tenosynovitis. 2. Thickening and intermediate signal of the superomedial and inferoplantar portions of the spring ligament complex may reflect degeneration and/or partial tear. 3. Pes planovalgus alignment of the ankle.  Assessment: 1.  Pes planovalgus alignment right foot 2.  PTTD right 3.  PT tendon rupture, nontraumatic right   Plan of Care:  1. Patient evaluated. X-Rays reviewed again today.  MRI reviewed today 2.  Today I recommend that the patient pursues surgical management.  Conservative modalities have been unsuccessful in providing any sort of  satisfactory alleviation of symptoms for the patient.  All possible complications and details the procedure were explained.  No guarantees were expressed or implied. 3.  Authorization for surgery initiated today.  Surgery will consist of triple arthrodesis right foot 4.  Injection of 0.5 cc Celestone Soluspan injected along the posterior tibial tendon sheath to alleviate some pain and symptoms until surgery  5.  Return to clinic 1 week postop  *[place] [facility] [place] M. Gennette Shadix, DPM [facility]  Dr. Edrick Kins, DPM    [address], [place] [address]                Office [phone]  Fax [phone]

## 2019-10-07 ENCOUNTER — Other Ambulatory Visit: Payer: Self-pay | Admitting: *Deleted

## 2019-10-07 DIAGNOSIS — M19079 Primary osteoarthritis, unspecified ankle and foot: Secondary | ICD-10-CM

## 2019-10-07 NOTE — Progress Notes (Signed)
Per Dr. Amalia Hailey, I ordered a knee scooter for Maria Poole to use post-operative.  She will be non-weight bearing.  I requested assistance from Jolee Ewing, [place], to get the knee scooter for the patient.

## 2019-10-15 ENCOUNTER — Telehealth: Payer: Self-pay | Admitting: *Deleted

## 2019-10-15 NOTE — Telephone Encounter (Signed)
DOS 10/28/2019 TRIPLE ARTHRODESIS RIGHT FOOT - 33354  BCBS: Eligibility Date - 10/28/2018 - 10/27/9998   In-Network    Max Per Benefit Period Year-to-Date Remaining  CoInsurance  20%    Deductible  $1250.00 $0.00  Out-Of-Pocket  $4890.00 $2444.83   AMBULATORY SURGERY  In Network  Copay Coinsurance  Not Applicable  56% per [place]

## 2019-10-19 ENCOUNTER — Other Ambulatory Visit: Payer: Self-pay | Admitting: Podiatry

## 2019-10-28 ENCOUNTER — Encounter: Payer: Self-pay | Admitting: Podiatry

## 2019-10-28 ENCOUNTER — Other Ambulatory Visit: Payer: Self-pay | Admitting: Podiatry

## 2019-10-28 DIAGNOSIS — M2141 Flat foot [pes planus] (acquired), right foot: Secondary | ICD-10-CM

## 2019-10-28 DIAGNOSIS — M76821 Posterior tibial tendinitis, right leg: Secondary | ICD-10-CM

## 2019-10-28 DIAGNOSIS — M19079 Primary osteoarthritis, unspecified ankle and foot: Secondary | ICD-10-CM

## 2019-10-28 MED ORDER — OXYCODONE-ACETAMINOPHEN 5-325 MG PO TABS: 1.0000 | ORAL_TABLET | Freq: Four times a day (QID) | ORAL | 0 refills | Status: DC | PRN

## 2019-10-28 NOTE — Progress Notes (Signed)
PRN postop 

## 2019-11-05 ENCOUNTER — Encounter: Payer: Self-pay | Admitting: Podiatry

## 2019-11-05 ENCOUNTER — Ambulatory Visit (INDEPENDENT_AMBULATORY_CARE_PROVIDER_SITE_OTHER): Payer: Self-pay | Admitting: Podiatry

## 2019-11-05 ENCOUNTER — Other Ambulatory Visit: Payer: Self-pay

## 2019-11-05 ENCOUNTER — Ambulatory Visit (INDEPENDENT_AMBULATORY_CARE_PROVIDER_SITE_OTHER): Payer: BC Managed Care – PPO

## 2019-11-05 VITALS — BP 128/73 | HR 79 | Temp 98.9°F

## 2019-11-05 DIAGNOSIS — M19079 Primary osteoarthritis, unspecified ankle and foot: Secondary | ICD-10-CM

## 2019-11-05 DIAGNOSIS — M19071 Primary osteoarthritis, right ankle and foot: Secondary | ICD-10-CM | POA: Diagnosis not present

## 2019-11-05 DIAGNOSIS — Z9889 Other specified postprocedural states: Secondary | ICD-10-CM

## 2019-11-05 MED ORDER — OXYCODONE-ACETAMINOPHEN 5-325 MG PO TABS: 1.0000 | ORAL_TABLET | Freq: Four times a day (QID) | ORAL | 0 refills | Status: DC | PRN

## 2019-11-05 MED ORDER — DOXYCYCLINE HYCLATE 100 MG PO TABS: 100.0000 mg | ORAL_TABLET | Freq: Two times a day (BID) | ORAL | 0 refills | Status: AC

## 2019-11-07 ENCOUNTER — Telehealth: Payer: Self-pay | Admitting: Podiatry

## 2019-11-07 NOTE — Telephone Encounter (Signed)
Patient called starting that she was having increase pain to the foot and she had a temp of 99.9. She states that she is having electrical shocks to her leg and the pain is worse. She reports that when she left the office after seeing Dr. Logan Bores that when getting in the car her husband moved the knee scooter too quick and she hit her foot on the pavement hard. There was apparently some drainage from the incision Friday and she was started on antibiotics. Due to increase in temperature and pain I have encouraged her to go to the ER for further evaluation of possible infection/DVT. She declined this. Discussed risk of not going. Since she is not going at this time discussed her pain medication and will have her come to the office in the morning. However if her temp goes any higher or any CP, SOB or other symptoms (which she currently denies) she must go. Her husband (?) was also on the phone today and verbalized understanding.   Vivi Barrack

## 2019-11-08 ENCOUNTER — Other Ambulatory Visit: Payer: Self-pay

## 2019-11-08 ENCOUNTER — Ambulatory Visit (INDEPENDENT_AMBULATORY_CARE_PROVIDER_SITE_OTHER): Payer: BC Managed Care – PPO

## 2019-11-08 ENCOUNTER — Ambulatory Visit (INDEPENDENT_AMBULATORY_CARE_PROVIDER_SITE_OTHER): Payer: Self-pay | Admitting: Podiatry

## 2019-11-08 DIAGNOSIS — M79671 Pain in right foot: Secondary | ICD-10-CM

## 2019-11-08 DIAGNOSIS — L97519 Non-pressure chronic ulcer of other part of right foot with unspecified severity: Secondary | ICD-10-CM | POA: Diagnosis not present

## 2019-11-08 MED ORDER — OXYCODONE-ACETAMINOPHEN 5-325 MG PO TABS: 1.0000 | ORAL_TABLET | Freq: Four times a day (QID) | ORAL | 0 refills | Status: DC | PRN

## 2019-11-08 MED ORDER — SULFAMETHOXAZOLE-TRIMETHOPRIM 800-160 MG PO TABS: 1.0000 | ORAL_TABLET | Freq: Two times a day (BID) | ORAL | 0 refills | Status: AC

## 2019-11-08 MED ORDER — CIPROFLOXACIN HCL 500 MG PO TABS: 500.0000 mg | ORAL_TABLET | Freq: Two times a day (BID) | ORAL | 0 refills | Status: DC

## 2019-11-09 ENCOUNTER — Telehealth: Payer: Self-pay | Admitting: Podiatry

## 2019-11-09 ENCOUNTER — Telehealth: Payer: Self-pay

## 2019-11-09 NOTE — Telephone Encounter (Signed)
I spoke with Dr. Logan Bores about patient wanting to see Dr. Logan Bores in [place], he stated she can wait until Friday to see him, but she needs to continue with the antibiotic and monitor for fever/chills.   I called and informed patient of Dr. Logan Bores recommendation, she said she feels the wound needs to be drained again so she will keep her appt with Dr. Allena Katz tomorrow in [place].

## 2019-11-09 NOTE — Telephone Encounter (Signed)
Hi Maria Poole pt has called to see about her appt she was told in the office on 11/08/19 w/evans in [place] location that she needed to come back in 2days on the 13th to have bandages replace she was scheduled for [place] location but she wants [place]. Can you ask what Dr.Evans would like to do about the pt appt. She did not want to come to [place] for bandage changes. Thank you so much

## 2019-11-09 NOTE — Progress Notes (Signed)
   Subjective:  Patient presents today status post triple arthrodesis right. DOS: 10/28/2019. She states she is doing well. She reports some nausea in the morning and mild pain that is tolerable with the pain medication. She denies any worsening factors. She has been nonweightbearing in the CAM boot as directed. Patient is here for further evaluation and treatment.   Past Medical History:  Diagnosis Date  . Asthma   . Hypertension       Objective/Physical Exam Neurovascular status intact.  Skin incisions appear to be well coapted with sutures and staples intact. No sign of infectious process noted. No dehiscence. No active bleeding noted. Moderate edema noted to the surgical extremity.  There is a small amount of drainage coming from the posterior heel incision site where the subtalar screw was entered.  No significant malodor noted.  Minimal erythema around the incision site.  No redness or streaking up the foot at the moment.  Radiographic Exam:  Orthopedic hardware and osteotomies sites appear to be stable with routine healing.  Assessment: 1. s/p triple arthrodesis right. DOS: 10/28/2019   Plan of Care:  1. Patient was evaluated. X-rays reviewed 2. Dressing changed. Keep clean, dry and intact for one week.  3. Continue nonweightbearing in CAM boot.  4. Refill prescription for Percocet provided to patient.  5. Prescription for Doxycycline 100 mg #20 provided to patient.  6. Return to clinic in 4 weeks.   Husband is an Personnel officer.    Felecia Shelling, DPM [facility]  Dr. Felecia Shelling, DPM    [address]                                        [place], [place] [address]                Office [phone]  Fax [phone]

## 2019-11-10 ENCOUNTER — Ambulatory Visit (INDEPENDENT_AMBULATORY_CARE_PROVIDER_SITE_OTHER): Payer: Self-pay | Admitting: Podiatry

## 2019-11-10 ENCOUNTER — Other Ambulatory Visit: Payer: Self-pay

## 2019-11-10 DIAGNOSIS — Z9889 Other specified postprocedural states: Secondary | ICD-10-CM

## 2019-11-10 DIAGNOSIS — L97519 Non-pressure chronic ulcer of other part of right foot with unspecified severity: Secondary | ICD-10-CM

## 2019-11-10 DIAGNOSIS — M79671 Pain in right foot: Secondary | ICD-10-CM

## 2019-11-10 DIAGNOSIS — T8140XD Infection following a procedure, unspecified, subsequent encounter: Secondary | ICD-10-CM

## 2019-11-12 ENCOUNTER — Other Ambulatory Visit: Payer: Self-pay | Admitting: Podiatry

## 2019-11-12 ENCOUNTER — Encounter: Payer: Self-pay | Admitting: Podiatry

## 2019-11-12 ENCOUNTER — Ambulatory Visit (INDEPENDENT_AMBULATORY_CARE_PROVIDER_SITE_OTHER): Payer: Self-pay | Admitting: Podiatry

## 2019-11-12 ENCOUNTER — Other Ambulatory Visit: Payer: Self-pay

## 2019-11-12 VITALS — Temp 97.8°F

## 2019-11-12 DIAGNOSIS — Z9889 Other specified postprocedural states: Secondary | ICD-10-CM

## 2019-11-12 DIAGNOSIS — L97519 Non-pressure chronic ulcer of other part of right foot with unspecified severity: Secondary | ICD-10-CM

## 2019-11-12 DIAGNOSIS — T8140XD Infection following a procedure, unspecified, subsequent encounter: Secondary | ICD-10-CM

## 2019-11-12 LAB — WOUND CULTURE
MICRO NUMBER:: 10033638
SPECIMEN QUALITY:: ADEQUATE

## 2019-11-14 NOTE — Progress Notes (Signed)
   Subjective:  Patient presents today status post triple arthrodesis right. DOS: 10/28/2019.  Patient states that pain has significantly improved since last visit.  She denies any ongoing fever.  She has currently been taking the antibiotics as prescribed for 1 day now.  No new complaints at this time  Past Medical History:  Diagnosis Date  . Asthma   . Hypertension       Objective/Physical Exam Neurovascular status intact.  There continues to be drainage to the posterior heel incision site.  Erythema noted around the lateral incision site of the foot as well.  No malodor noted.  No streaking.  No tightness or erythema of the calf.  Overall pain has significantly improved.  Assessment: 1. s/p triple arthrodesis right. DOS: 10/28/2019 2.  Postoperative infection right foot-stable   Plan of Care:  1. Patient was evaluated.  2. Dressing changed. Keep clean, dry and intact until next follow-up visit 3. Continue nonweightbearing in CAM boot.  4.  Cultures taken of the posterior heel pending 5.  Continue Bactrim DS and Cipro 500 mg as prescribed x10 days 6.  To stainless steel skin staples were removed from the lateral incision site where the erythema was identified.  There was some purulent drainage expressed.  The wounds from the posterior heel as well as where the skin staples were removed were packed with iodoform gauze packing. 7.  I will continue to see the patient every other day until symptoms improve regarding the postoperative infection.  I did explain that they still may need to go to the hospital if they do not notice significant improvement or if fever returns, or if she experiences swelling and increased warmth extending up the leg. 8.  Return to clinic in 2 days in [place] office  Husband is an Personnel officer.    Felecia Shelling, DPM [facility]  Dr. Felecia Shelling, DPM    [address]                                        [place], [place] [address]                 Office [phone]  Fax [phone]

## 2019-11-14 NOTE — Progress Notes (Signed)
   Subjective:  Patient presents today status post triple arthrodesis right. DOS: 10/28/2019.  Patient states continued improvement with improvement of pain. She has been nonweightbearing as instructed with the CAM boot and walker.  She has also been taking her antibiotics as prescribed  Past Medical History:  Diagnosis Date  . Asthma   . Hypertension       Objective/Physical Exam Neurovascular status intact.  There continues to be drainage to the posterior heel incision site, with some modest improvement.  Erythema noted around the lateral incision site of the foot as well appears to be stable.  No malodor noted.  No streaking.  No tightness or erythema of the calf.  Overall pain has significantly improved.  Assessment: 1. s/p triple arthrodesis right. DOS: 10/28/2019 2.  Postoperative infection right foot-stable, modest improvement   Plan of Care:  1. Patient was evaluated.  Cultures reviewed 2. Dressing changed. Keep clean, dry and intact until next follow-up visit 3. Continue nonweightbearing in CAM boot.  4.  Continue oral antibiotics Bactrim DS and Cipro 500 mg as prescribed 5.  Return to clinic on Monday, 11/15/2019, for follow-up evaluation.  Again, if patient begins to notice the same symptoms as cautioned on prior visit she needs to go to the emergency department over the weekend.  Husband is an Personnel officer.    Felecia Shelling, DPM [facility]  Dr. Felecia Shelling, DPM    [address]                                        [place], [place] [address]                Office [phone]  Fax [phone]

## 2019-11-14 NOTE — Progress Notes (Signed)
   Subjective:  Patient presents today status post triple arthrodesis right. DOS: 10/28/2019.  Patient continues to present with pain status post surgery.  She says the pain radiates up to her leg and hip.  She is concerned is because she is sitting in positions that are aggravating her hip.  She did experience a low-grade fever last night less than 100 F.  Past Medical History:  Diagnosis Date  . Asthma   . Hypertension       Objective/Physical Exam Neurovascular status intact.  There is some increased warmth and edema noted to the foot.  Increased drainage noted to the posterior heel as previously mentioned.  Negative for any significant warmth or tenderness in the calf.  Radiographic Exam:  Orthopedic hardware and osteotomies sites appear to be stable with routine healing.  Assessment: 1. s/p triple arthrodesis right. DOS: 10/28/2019 2.  Postoperative infection right foot   Plan of Care:  1. Patient was evaluated. X-rays reviewed 2. Dressing changed. Keep clean, dry and intact for one week.  3. Continue nonweightbearing in CAM boot.  4.  Culture taken of the posterior heel drainage since there is a significant amount of increased drainage. 5.  Prescription for Bactrim DS twice daily #20 6.  Prescription for ciprofloxacin 500 mg twice daily #20 7.  Return to clinic in 2 days.  If there is no significant improvement she may be may need to go to the emergency department for stronger IV antibiotics to manage the postoperative infection.  If she also experiences persistent fever she needs to go immediately to the emergency department for blood work and evaluation.  Patient and spouse understand instructions  Husband is an Personnel officer.    Felecia Shelling, DPM [facility]  Dr. Felecia Shelling, DPM    [address]                                        [place], [place] [address]                Office [phone]  Fax [phone]

## 2019-11-15 ENCOUNTER — Ambulatory Visit (INDEPENDENT_AMBULATORY_CARE_PROVIDER_SITE_OTHER): Payer: BC Managed Care – PPO | Admitting: Podiatry

## 2019-11-15 ENCOUNTER — Other Ambulatory Visit: Payer: Self-pay

## 2019-11-15 DIAGNOSIS — Z9889 Other specified postprocedural states: Secondary | ICD-10-CM

## 2019-11-15 DIAGNOSIS — T8140XD Infection following a procedure, unspecified, subsequent encounter: Secondary | ICD-10-CM

## 2019-11-15 MED ORDER — OXYCODONE-ACETAMINOPHEN 5-325 MG PO TABS: 1.0000 | ORAL_TABLET | Freq: Four times a day (QID) | ORAL | 0 refills | Status: DC | PRN

## 2019-11-15 MED ORDER — CIPROFLOXACIN HCL 500 MG PO TABS: 500.0000 mg | ORAL_TABLET | Freq: Two times a day (BID) | ORAL | 0 refills | Status: DC

## 2019-11-16 ENCOUNTER — Encounter: Payer: BC Managed Care – PPO | Admitting: Podiatry

## 2019-11-16 NOTE — Progress Notes (Signed)
   Subjective:  Patient presents today status post triple arthrodesis right. DOS: 10/28/2019.  Patient has subsequently developed a postoperative infection to the right foot and she has been on oral Bactrim DS and ciprofloxacin 500 mg since last week.  She has noticed a reduction in her pain.  She is kept the dressings clean dry and intact as directed.  We are currently seeing the patient every other day for dressing changes.  She presents for follow-up treatment and evaluation  Past Medical History:  Diagnosis Date  . Asthma   . Hypertension      Objective/Physical Exam Neurovascular status intact.  There continues to be drainage to the posterior heel incision site, however this has improved significantly with a significant reduction in drainage.  Negative for any purulence at the moment.  Drainage appears to be somewhat mostly sanguinous.  Erythema noted around the lateral incision site of the foot as well appears to be stable and improved significantly since last visit.  No malodor noted.  No streaking.  No tightness or erythema of the calf.  Overall pain has significantly improved.  Assessment: 1. s/p triple arthrodesis right. DOS: 10/28/2019 2.  Postoperative infection right foot-stable, with significant improvement  Plan of Care:  1. Patient was evaluated.  Continue the oral Bactrim DS and Cipro 500 mg as prescribed.  Once the patient has finished this course of antibiotics we will discontinue the Bactrim DS and continue the Cipro 500 mg BID for an additional 10 days.  This is based on cultures taken prior. 2. Dressing changed. Keep clean, dry and intact until next follow-up visit 3. Continue nonweightbearing in CAM boot.  4.  Refill prescription for Percocet 5/325 mg 5.  Return to clinic in 5 days  Husband is an Personnel officer.    Felecia Shelling, DPM [facility]  Dr. Felecia Shelling, DPM    [address] [place], [place] [address]                 Office [phone]  Fax [phone]

## 2019-11-19 ENCOUNTER — Other Ambulatory Visit: Payer: Self-pay

## 2019-11-19 ENCOUNTER — Ambulatory Visit (INDEPENDENT_AMBULATORY_CARE_PROVIDER_SITE_OTHER): Payer: BC Managed Care – PPO | Admitting: Podiatry

## 2019-11-19 DIAGNOSIS — L97519 Non-pressure chronic ulcer of other part of right foot with unspecified severity: Secondary | ICD-10-CM

## 2019-11-19 DIAGNOSIS — T8140XD Infection following a procedure, unspecified, subsequent encounter: Secondary | ICD-10-CM

## 2019-11-19 DIAGNOSIS — Z9889 Other specified postprocedural states: Secondary | ICD-10-CM

## 2019-11-19 MED ORDER — OXYCODONE-ACETAMINOPHEN 5-325 MG PO TABS: 1.0000 | ORAL_TABLET | Freq: Four times a day (QID) | ORAL | 0 refills | Status: DC | PRN

## 2019-11-30 ENCOUNTER — Other Ambulatory Visit: Payer: Self-pay

## 2019-11-30 ENCOUNTER — Ambulatory Visit (INDEPENDENT_AMBULATORY_CARE_PROVIDER_SITE_OTHER): Payer: BC Managed Care – PPO

## 2019-11-30 ENCOUNTER — Ambulatory Visit (INDEPENDENT_AMBULATORY_CARE_PROVIDER_SITE_OTHER): Payer: BC Managed Care – PPO | Admitting: Podiatry

## 2019-11-30 DIAGNOSIS — Z9889 Other specified postprocedural states: Secondary | ICD-10-CM

## 2019-11-30 DIAGNOSIS — M19079 Primary osteoarthritis, unspecified ankle and foot: Secondary | ICD-10-CM

## 2019-11-30 DIAGNOSIS — M19071 Primary osteoarthritis, right ankle and foot: Secondary | ICD-10-CM

## 2019-11-30 MED ORDER — OXYCODONE-ACETAMINOPHEN 5-325 MG PO TABS: 1.0000 | ORAL_TABLET | Freq: Four times a day (QID) | ORAL | 0 refills | Status: DC | PRN

## 2019-11-30 MED ORDER — CIPROFLOXACIN HCL 500 MG PO TABS: 500.0000 mg | ORAL_TABLET | Freq: Two times a day (BID) | ORAL | 0 refills | Status: AC

## 2019-11-30 NOTE — Progress Notes (Signed)
   Subjective:  Patient presents today status post triple arthrodesis right. DOS: 10/28/2019.  Patient has subsequently developed a postoperative infection to the right foot and she has been on ciprofloxacin 500 mg with significant improvement.  She has continued to noticed a reduction in her pain.  She is kept the dressings clean dry and intact as directed.  She was last seen in the office on 11/15/2019.  Past Medical History:  Diagnosis Date  . Asthma   . Hypertension      Objective/Physical Exam Neurovascular status intact.  There continues to be drainage to the posterior heel incision site, however this has improved significantly with a significant reduction in drainage.  Negative for any purulence at the moment.  Drainage appears to be somewhat mostly sanguinous.  Erythema noted around the lateral incision site of the foot as well appears to be stable and improved significantly since last visit.  No malodor noted.  No streaking.  No tightness or erythema of the calf.  Overall pain has significantly improved.  Reduced edema noted with noticeable improvement in wrinkles in the skin  Assessment: 1. s/p triple arthrodesis right. DOS: 10/28/2019 2.  Postoperative infection right foot-stable, with significant improvement  Plan of Care:  1. Patient was evaluated.  2.  Skin staples were removed today 3.  Continue ciprofloxacin 500 mg 2 times daily 4.  Refill prescription for Percocet 5/325 mg 5.  Return to clinic in 1 week  Husband is an Personnel officer.    Felecia Shelling, DPM [facility]  Dr. Felecia Shelling, DPM    [address] [place], [place] [address]                Office [phone]  Fax [phone]

## 2019-12-03 NOTE — Progress Notes (Signed)
   Subjective:  Patient presents today status post triple arthrodesis right. DOS: 10/28/2019. She reports intermittent shooting and throbbing pain. She has taken the Cipro and Percocet for treatment as directed. She denies any specific modifying factors. Patient is here for further evaluation and treatment.   Past Medical History:  Diagnosis Date  . Asthma   . Hypertension      Objective/Physical Exam Neurovascular status intact.  There continues to be drainage to the posterior heel incision site, however this has improved significantly with a significant reduction in drainage.  Negative for any purulence at the moment.  Drainage appears to be somewhat mostly sanguinous.  Erythema noted around the lateral incision site of the foot as well appears to be stable and improved significantly since last visit.  No malodor noted.  No streaking.  No tightness or erythema of the calf.  Overall pain has significantly improved.  Reduced edema noted with noticeable improvement in wrinkles in the skin  Radiographic Exam:  Orthopedic hardware and osteotomies sites appear to be stable with routine healing.   Assessment: 1. s/p triple arthrodesis right. DOS: 10/28/2019 2. Postoperative infection right foot-stable, with significant improvement  Plan of Care:  1. Patient was evaluated. X-Rays reviewed.  2. Refill one final prescription for Cipro 500 mg BID for 10 days.  3. Continue nonweightbearing in CAM boot for an additional four weeks.  4. Ace wrap dispensed.  5. Refill prescription for Percocet 5/325 mg provided to patient.  6. Return to clinic in 2 weeks.   Husband is an Personnel officer.    Felecia Shelling, DPM [facility]  Dr. Felecia Shelling, DPM    [address] [place], [place] [address]                Office [phone]  Fax [phone]

## 2019-12-14 ENCOUNTER — Ambulatory Visit (INDEPENDENT_AMBULATORY_CARE_PROVIDER_SITE_OTHER): Payer: BC Managed Care – PPO

## 2019-12-14 ENCOUNTER — Other Ambulatory Visit: Payer: Self-pay

## 2019-12-14 ENCOUNTER — Ambulatory Visit (INDEPENDENT_AMBULATORY_CARE_PROVIDER_SITE_OTHER): Payer: BC Managed Care – PPO | Admitting: Podiatry

## 2019-12-14 ENCOUNTER — Encounter: Payer: Self-pay | Admitting: *Deleted

## 2019-12-14 DIAGNOSIS — M19079 Primary osteoarthritis, unspecified ankle and foot: Secondary | ICD-10-CM

## 2019-12-14 DIAGNOSIS — M19071 Primary osteoarthritis, right ankle and foot: Secondary | ICD-10-CM | POA: Diagnosis not present

## 2019-12-14 DIAGNOSIS — Z9889 Other specified postprocedural states: Secondary | ICD-10-CM

## 2019-12-16 NOTE — Progress Notes (Signed)
   Subjective:  Patient presents today status post triple arthrodesis right. DOS: 10/28/2019. She states she is doing well and has improved significantly since her last visit. She reports some soreness to the dorsal foot but states she is rarely taking any pain medication. She denies worsening factors. She has completed the course of Cipro as directed. Patient is here for further evaluation and treatment.   Past Medical History:  Diagnosis Date  . Asthma   . Hypertension      Objective/Physical Exam Neurovascular status intact.  Skin incisions appear to be well coapted with sutures and staples intact. No sign of infectious process noted. No dehiscence. No active bleeding noted. Moderate edema noted to the surgical extremity.   Assessment: 1. s/p triple arthrodesis right. DOS: 10/28/2019 2. Postoperative infection right foot - resolved   Plan of Care:  1. Patient was evaluated. 2. Continue nonweightbearing in CAM boot for 10 more days, then begin weightbearing in CAM boot.  3. Orders for physical therapy from [facility].  4. Return to clinic in 4 weeks.   Husband is an Personnel officer.    Felecia Shelling, DPM [facility]  Dr. Felecia Shelling, DPM    [address] [place], [place] [address]                Office [phone]  Fax [phone]

## 2020-01-05 ENCOUNTER — Telehealth: Payer: Self-pay

## 2020-01-05 NOTE — Telephone Encounter (Signed)
Left message informing pt that it was not unusual for the surgery foot to turn purple after having a hot shower, the body is still trying to acclimate the circulation back to normal after surgery and the hot water causes the red, and white blood cells to rush to the surgery foot, she should direct the shower water away from the surgery foot and rest and elevate.

## 2020-01-05 NOTE — Telephone Encounter (Signed)
Pt called stating that she see Dr. Logan Bores. Pt noticed when she gets out the shower her foot turns purples, wants to know if this is normal. Pt stated that it does go back to normal few minutes. Pt is just concerned.

## 2020-01-06 ENCOUNTER — Ambulatory Visit: Payer: BC Managed Care – PPO | Attending: Internal Medicine

## 2020-01-06 DIAGNOSIS — Z23 Encounter for immunization: Secondary | ICD-10-CM

## 2020-01-06 NOTE — Progress Notes (Signed)
   Covid-19 Vaccination Clinic  Name:  Maria Poole    MRN: [id number] DOB: 06/12/1961  01/06/2020  Maria Poole was observed post Covid-19 immunization for 15 minutes without incident. She was provided with Vaccine Information Sheet and instruction to access the V-Safe system.   Maria Poole was instructed to call 911 with any severe reactions post vaccine: Marland Kitchen Difficulty breathing  . Swelling of face and throat  . A fast heartbeat  . A bad rash all over body  . Dizziness and weakness   Immunizations Administered    Name Date Dose VIS Date Route   Pfizer COVID-19 Vaccine 01/06/2020 11:50 AM 0.3 mL 10/08/2019 Intramuscular   Manufacturer: [employer], [employer]   Lot: [id number]   NDC: [address]-1

## 2020-01-11 ENCOUNTER — Encounter: Payer: BC Managed Care – PPO | Admitting: Podiatry

## 2020-01-14 ENCOUNTER — Encounter: Payer: Self-pay | Admitting: Podiatry

## 2020-01-14 ENCOUNTER — Other Ambulatory Visit: Payer: Self-pay | Admitting: Podiatry

## 2020-01-14 ENCOUNTER — Encounter: Payer: Self-pay | Admitting: *Deleted

## 2020-01-14 ENCOUNTER — Ambulatory Visit (INDEPENDENT_AMBULATORY_CARE_PROVIDER_SITE_OTHER): Payer: BC Managed Care – PPO

## 2020-01-14 ENCOUNTER — Ambulatory Visit (INDEPENDENT_AMBULATORY_CARE_PROVIDER_SITE_OTHER): Payer: BC Managed Care – PPO | Admitting: Podiatry

## 2020-01-14 ENCOUNTER — Other Ambulatory Visit: Payer: Self-pay

## 2020-01-14 VITALS — Temp 98.3°F

## 2020-01-14 DIAGNOSIS — M76821 Posterior tibial tendinitis, right leg: Secondary | ICD-10-CM

## 2020-01-14 DIAGNOSIS — M19079 Primary osteoarthritis, unspecified ankle and foot: Secondary | ICD-10-CM

## 2020-01-14 DIAGNOSIS — Z9889 Other specified postprocedural states: Secondary | ICD-10-CM

## 2020-01-14 MED ORDER — OXYCODONE-ACETAMINOPHEN 5-325 MG PO TABS: 1.0000 | ORAL_TABLET | Freq: Four times a day (QID) | ORAL | 0 refills | Status: AC | PRN

## 2020-01-18 NOTE — Progress Notes (Signed)
   Subjective:  Patient presents today status post triple arthrodesis right. DOS: 10/28/2019. She reports continued soreness of the foot. She has been in physical therapy and reports increased soreness afterwards. She reports some discoloration of the foot. She reports wearing slippers and inserts at home. Patient is here for further evaluation and treatment.   Past Medical History:  Diagnosis Date  . Asthma   . Hypertension      Objective/Physical Exam Neurovascular status intact.  Skin incisions appear to be well coapted. No sign of infectious process noted. No dehiscence. No active bleeding noted. Moderate edema noted to the surgical extremity.  Radiographic Exam:  Orthopedic hardware and osteotomies sites appear to be stable with routine healing.  Assessment: 1. s/p triple arthrodesis right. DOS: 10/28/2019   Plan of Care:  1. Patient was evaluated. X-Rays reviewed.  2. Compression anklet dispensed.  3. Continue physical therapy.  4. Refill prescription for Percocet 5/325 mg provided to patient.  5. We will extend returning to work for one additional month.  6. Return to clinic in 6 weeks.   Husband is an Personnel officer.    Felecia Shelling, DPM [facility]  Dr. Felecia Shelling, DPM    [address] [place], [place] [address]                Office [phone]  Fax [phone]

## 2020-02-01 ENCOUNTER — Ambulatory Visit: Payer: BC Managed Care – PPO | Attending: Internal Medicine

## 2020-02-01 DIAGNOSIS — Z23 Encounter for immunization: Secondary | ICD-10-CM

## 2020-02-01 NOTE — Progress Notes (Signed)
   Covid-19 Vaccination Clinic  Name:  MYLO DRISKILL    MRN: [id number] DOB: September 14, 1961  02/01/2020  Ms. Parilla was observed post Covid-19 immunization for 15 minutes without incident. She was provided with Vaccine Information Sheet and instruction to access the V-Safe system.   Ms. Zurn was instructed to call 911 with any severe reactions post vaccine: Marland Kitchen Difficulty breathing  . Swelling of face and throat  . A fast heartbeat  . A bad rash all over body  . Dizziness and weakness   Immunizations Administered    Name Date Dose VIS Date Route   Pfizer COVID-19 Vaccine 02/01/2020 12:12 PM 0.3 mL 10/08/2019 Intramuscular   Manufacturer: [employer], [employer]   Lot: [id number]   NDC: [address]-7

## 2020-02-25 ENCOUNTER — Encounter: Payer: BC Managed Care – PPO | Admitting: Podiatry

## 2020-03-03 ENCOUNTER — Ambulatory Visit (INDEPENDENT_AMBULATORY_CARE_PROVIDER_SITE_OTHER): Payer: BC Managed Care – PPO | Admitting: Podiatry

## 2020-03-03 ENCOUNTER — Encounter: Payer: Self-pay | Admitting: Podiatry

## 2020-03-03 ENCOUNTER — Ambulatory Visit (INDEPENDENT_AMBULATORY_CARE_PROVIDER_SITE_OTHER): Payer: BC Managed Care – PPO

## 2020-03-03 ENCOUNTER — Other Ambulatory Visit: Payer: Self-pay

## 2020-03-03 DIAGNOSIS — M76821 Posterior tibial tendinitis, right leg: Secondary | ICD-10-CM

## 2020-03-03 DIAGNOSIS — Z9889 Other specified postprocedural states: Secondary | ICD-10-CM

## 2020-03-03 DIAGNOSIS — M19079 Primary osteoarthritis, unspecified ankle and foot: Secondary | ICD-10-CM | POA: Diagnosis not present

## 2020-03-03 MED ORDER — HYDROCODONE-ACETAMINOPHEN 5-325 MG PO TABS: 1.0000 | ORAL_TABLET | Freq: Four times a day (QID) | ORAL | 0 refills | Status: AC | PRN

## 2020-03-08 NOTE — Progress Notes (Signed)
   Subjective:  Patient presents today status post triple arthrodesis right. DOS: 10/28/2019. She states she is doing well. She denies any significant pain or modifying factors. She has completed physical therapy and presents with paper work to return to work. Patient is here for further evaluation and treatment.   Past Medical History:  Diagnosis Date  . Asthma   . Hypertension      Objective/Physical Exam Neurovascular status intact.  Skin incisions appear to be well coapted. No sign of infectious process noted. No dehiscence. No active bleeding noted. Moderate edema noted to the surgical extremity.  Assessment: 1. s/p triple arthrodesis right. DOS: 10/28/2019   Plan of Care:  1. Patient was evaluated.  2. Patient completed physical therapy.  3. Return to work on 03/13/2020 with restrictions for two months.  4. May resume full activity with no restrictions in 2 months.  5. Continue using custom orthotics.  6. Return to clinic as needed.   Husband is an Personnel officer.    Felecia Shelling, DPM [facility]  Dr. Felecia Shelling, DPM    [address] [place], [place] [address]                Office [phone]  Fax [phone]

## 2020-03-20 ENCOUNTER — Telehealth: Payer: Self-pay | Admitting: *Deleted

## 2020-03-20 NOTE — Telephone Encounter (Signed)
Yes, okay to write a note taking her out of work for 3 months. - Dr. Logan Bores

## 2020-03-20 NOTE — Telephone Encounter (Signed)
"  I need a note from Dr. Logan Bores stating I can be out of work.  I went back to work and worked a four hour shift at [employer].  I was in a lot of pain.  I just can't do it.  I'm in the process of applying for disability."  Dr. Logan Bores can't put you on permanent disability.  You will need to start that process through your primary care physician.  "Can I have a note to be out for three months then?"  I'll send Dr. Logan Bores a message and see what he says.  "When will I know?"  I can't say, I have to wait on a response from Dr. Logan Bores.

## 2020-03-21 ENCOUNTER — Encounter: Payer: Self-pay | Admitting: *Deleted

## 2020-03-21 NOTE — Telephone Encounter (Signed)
I am calling to let you know that Dr. Logan Bores said you can be out of work for three more months.  I sent you a letter in MyChart.  "Okay, thanks.  I was wondering what that was that popped up on my phone."

## 2020-04-15 ENCOUNTER — Other Ambulatory Visit: Payer: Self-pay | Admitting: Podiatry

## 2020-04-19 ENCOUNTER — Other Ambulatory Visit: Payer: Self-pay | Admitting: Family Medicine

## 2020-04-19 DIAGNOSIS — Z1231 Encounter for screening mammogram for malignant neoplasm of breast: Secondary | ICD-10-CM

## 2020-04-20 ENCOUNTER — Other Ambulatory Visit: Payer: Self-pay | Admitting: Family Medicine

## 2020-04-20 DIAGNOSIS — N6320 Unspecified lump in the left breast, unspecified quadrant: Secondary | ICD-10-CM

## 2020-05-09 DIAGNOSIS — M79676 Pain in unspecified toe(s): Secondary | ICD-10-CM

## 2020-06-13 ENCOUNTER — Telehealth: Payer: Self-pay | Admitting: *Deleted

## 2020-06-13 NOTE — Telephone Encounter (Signed)
"  We had submitted a medical request on the 13th of July.  We wanted to check and see if that request has been received.  It should have been mailed to you.  If it hasn't or if there's any issues, please give me a call.  We can fax a new request over if needed."

## 2020-07-05 NOTE — Telephone Encounter (Signed)
"  I'm returning your call in regards to medical records for Maria Poole.  I got your message.  Unfortunately, I will be out until Tuesday of next week.  I can be reached first thing that morning, starting at 7:30 am."

## 2020-09-05 ENCOUNTER — Other Ambulatory Visit: Payer: Self-pay

## 2020-09-05 ENCOUNTER — Ambulatory Visit
Admission: RE | Admit: 2020-09-05 | Discharge: 2020-09-05 | Disposition: A | Discharge status: Home / Self Care | Payer: BC Managed Care – PPO | Source: Ambulatory Visit | Attending: Family Medicine | Admitting: Family Medicine

## 2020-09-05 DIAGNOSIS — N6320 Unspecified lump in the left breast, unspecified quadrant: Secondary | ICD-10-CM | POA: Diagnosis present

## 2020-10-28 ENCOUNTER — Other Ambulatory Visit: Payer: Self-pay | Admitting: Podiatry

## 2020-11-03 NOTE — Telephone Encounter (Signed)
Please advise 

## 2021-01-18 ENCOUNTER — Other Ambulatory Visit: Payer: Self-pay | Admitting: Podiatry

## 2021-01-19 NOTE — Telephone Encounter (Signed)
Please Advise

## 2021-10-25 ENCOUNTER — Other Ambulatory Visit: Payer: Self-pay | Admitting: Family Medicine

## 2021-10-25 DIAGNOSIS — Z1231 Encounter for screening mammogram for malignant neoplasm of breast: Secondary | ICD-10-CM

## 2022-01-24 ENCOUNTER — Ambulatory Visit: Payer: BC Managed Care – PPO | Admitting: Podiatry

## 2022-01-24 DIAGNOSIS — Q666 Other congenital valgus deformities of feet: Secondary | ICD-10-CM

## 2022-01-25 ENCOUNTER — Telehealth: Payer: Self-pay

## 2022-01-25 NOTE — Telephone Encounter (Signed)
Casts sent to central fabrication °

## 2022-01-30 ENCOUNTER — Encounter: Payer: Self-pay | Admitting: Podiatry

## 2022-01-30 NOTE — Progress Notes (Signed)
?Subjective:  ?Patient ID: Maria Poole, female    DOB: [birth year],  MRN: [id number] ? ?Chief Complaint  ?Patient presents with  ? Foot Orthotics  ? ? ?61 y.o. female presents with the above complaint.  Patient presents with complaint of bilateral flatfoot deformity.  She states that it is painful to walk and has progressed to start causing her some arch pain.  She states that she used to wear orthotics that seems to be worn out.  She would like to get another pair of orthotics.  She states that they tend to help her a lot with ambulation.  She denies any other acute complaints.  She has not seen a podiatrist doing quite some time.  She denies any other acute complaints. ? ? ?Review of Systems: Negative except as noted in the HPI. Denies N/V/F/Ch. ? ?Past Medical History:  ?Diagnosis Date  ? Asthma   ? Hypertension   ? ? ?Current Outpatient Medications:  ?  ALPRAZolam (XANAX) 0.5 MG tablet, Take 1 tablet by mouth as needed., Disp: , Rfl:  ?  aspirin EC 81 MG tablet, Take by mouth., Disp: , Rfl:  ?  atorvastatin (LIPITOR) 20 MG tablet, Take 20 mg by mouth daily., Disp: , Rfl:  ?  atorvastatin (LIPITOR) 40 MG tablet, Take 40 mg by mouth daily., Disp: , Rfl:  ?  b complex vitamins capsule, Take by mouth., Disp: , Rfl:  ?  buPROPion (WELLBUTRIN XL) 150 MG 24 hr tablet, Take 1 tablet by mouth 2 (two) times daily., Disp: , Rfl:  ?  celecoxib (CELEBREX) 200 MG capsule, TAKE 1 CAPSULE BY MOUTH TWICE A DAY, Disp: 60 capsule, Rfl: 2 ?  cetirizine (ZYRTEC) 10 MG tablet, Take 10 mg by mouth daily., Disp: , Rfl:  ?  chlorthalidone (HYGROTON) 25 MG tablet, Take 1 tablet by mouth daily., Disp: , Rfl: 5 ?  ciprofloxacin (CIPRO) 500 MG tablet, Take 1 tablet (500 mg total) by mouth 2 (two) times daily., Disp: 20 tablet, Rfl: 0 ?  diphenhydramine-acetaminophen (TYLENOL PM) 25-500 MG TABS tablet, Take by mouth., Disp: , Rfl:  ?  doxycycline (VIBRA-TABS) 100 MG tablet, Take 1 tablet (100 mg total) by mouth 2 (two) times daily., Disp:  20 tablet, Rfl: 0 ?  etodolac (LODINE) 500 MG tablet, Take 1 tablet by mouth 2 (two) times daily., Disp: , Rfl: 0 ?  hydrochlorothiazide (HYDRODIURIL) 25 MG tablet, Take 25 mg by mouth daily., Disp: , Rfl:  ?  HYDROcodone-acetaminophen (NORCO/VICODIN) 5-325 MG tablet, Take 1 tablet by mouth every 6 (six) hours as needed for moderate pain., Disp: 30 tablet, Rfl: 0 ?  KLOR-CON 8 MEQ tablet, TAKE 1 TABLET (8 MEQ TOTAL) BY MOUTH ONCE DAILY, Disp: , Rfl:  ?  losartan (COZAAR) 100 MG tablet, Take 1 tablet by mouth daily., Disp: , Rfl:  ?  Magnesium 250 MG TABS, Take by mouth., Disp: , Rfl:  ?  Melatonin (MELATONIN MAXIMUM STRENGTH) 5 MG TABS, Take by mouth., Disp: , Rfl:  ?  metFORMIN (GLUCOPHAGE) 500 MG tablet, Take 500 mg by mouth 2 (two) times daily with a meal., Disp: , Rfl:  ?  methylPREDNISolone (MEDROL DOSEPAK) 4 MG TBPK tablet, 6 day dose pack - take as directed, Disp: 21 tablet, Rfl: 0 ?  montelukast (SINGULAIR) 10 MG tablet, TAKE 1 TABLET BY MOUTH EVERY DAY AT NIGHT, Disp: , Rfl:  ?  omeprazole (PRILOSEC) 40 MG capsule, Take 1 capsule by mouth daily., Disp: , Rfl:  ?  oxyCODONE-acetaminophen (  PERCOCET) 5-325 MG tablet, Take 1 tablet by mouth every 6 (six) hours as needed for severe pain., Disp: 30 tablet, Rfl: 0 ?  polyethylene glycol powder (MIRALAX) powder, Take by mouth., Disp: , Rfl:  ?  promethazine (PHENERGAN) 25 MG tablet, Take 1 tablet by mouth every 6 (six) hours as needed., Disp: , Rfl:  ?  sulfamethoxazole-trimethoprim (BACTRIM DS) 800-160 MG tablet, Take 1 tablet by mouth 2 (two) times daily., Disp: 20 tablet, Rfl: 0 ?  traZODone (DESYREL) 50 MG tablet, TAKE 1-2 TABLETS BY MOUTH NIGHTLY., Disp: , Rfl:  ?  venlafaxine XR (EFFEXOR-XR) 37.5 MG 24 hr capsule, Take 37.5 mg by mouth daily., Disp: , Rfl: 0 ? ?Social History  ? ?Tobacco Use  ?Smoking Status Never  ?Smokeless Tobacco Never  ? ? ?No Known Allergies ?Objective:  ?There were no vitals filed for this visit. ?There is no height or weight on file  to calculate BMI. ?Constitutional Well developed. ?Well nourished.  ?Vascular Dorsalis pedis pulses palpable bilaterally. ?Posterior tibial pulses palpable bilaterally. ?Capillary refill normal to all digits.  ?No cyanosis or clubbing noted. ?Pedal hair growth normal.  ?Neurologic Normal speech. ?Oriented to person, place, and time. ?Epicritic sensation to light touch grossly present bilaterally.  ?Dermatologic Nails well groomed and normal in appearance. ?No open wounds. ?No skin lesions.  ?Orthopedic: Gait examination shows pes planovalgus foot structure with calcaneovalgus to many toe signs partially recruit the arch with dorsiflexion of the hallux.  Unable to perform single and double heel raise.  ? ?Radiographs: None ?Assessment:  ? ?1. Pes planovalgus   ? ?Plan:  ?Patient was evaluated and treated and all questions answered. ? ?Bilateral pes planovalgus foot deformity ?-All questions and concerns were discussed with the patient in extensive detail ?-Given the flatfoot deformity I believe patient will benefit from custom-made orthotics to help control the hindfoot motion support the arches of her foot take the stress away from the bottom of the arch.  I discussed with the patient she states understand like to obtain orthotics ?-She was casted fo orthotics ? ?No follow-ups on file.  ?

## 2022-02-28 ENCOUNTER — Ambulatory Visit: Payer: BC Managed Care – PPO

## 2022-02-28 DIAGNOSIS — M19079 Primary osteoarthritis, unspecified ankle and foot: Secondary | ICD-10-CM

## 2022-02-28 DIAGNOSIS — Z9889 Other specified postprocedural states: Secondary | ICD-10-CM

## 2022-02-28 DIAGNOSIS — Q666 Other congenital valgus deformities of feet: Secondary | ICD-10-CM

## 2022-02-28 DIAGNOSIS — M76821 Posterior tibial tendinitis, right leg: Secondary | ICD-10-CM

## 2022-02-28 NOTE — Progress Notes (Signed)
SITUATION: ?Reason for Visit: Fitting and Delivery of Custom Fabricated Foot Orthoses ?Patient Report: Patient reports comfort and is satisfied with device. ? ?OBJECTIVE DATA: ?Patient History / Diagnosis:   ?  ICD-10-CM   ?1. Pes planovalgus  Q66.6   ?  ?2. Osteoarthritis of ankle and foot, unspecified laterality  M19.079   ?  ?3. Posterior tibial tendinitis, right  M76.821   ?  ?4. S/P foot surgery, right  Z98.890   ?  ? ? ?Provided Device:  Custom Functional Foot Orthotics ?    [employer]: [id number] ? ?GOAL OF ORTHOSIS ?- Improve gait ?- Decrease energy expenditure ?- Improve Balance ?- Provide Triplanar stability of foot complex ?- Facilitate motion ? ?ACTIONS PERFORMED ?Patient was fit with foot orthotics trimmed to shoe last. Patient tolerated fittign procedure.  ? ?Patient was provided with verbal and written instruction and demonstration regarding donning, doffing, wear, care, proper fit, function, purpose, cleaning, and use of the orthosis and in all related precautions and risks and benefits regarding the orthosis. ? ?Patient was also provided with verbal instruction regarding how to report any failures or malfunctions of the orthosis and necessary follow up care. Patient was also instructed to contact our office regarding any change in status that may affect the function of the orthosis. ? ?Patient demonstrated independence with proper donning, doffing, and fit and verbalized understanding of all instructions. ? ?PLAN: ?Patient is to follow up in one week or as necessary (PRN). All questions were answered and concerns addressed. Plan of care was discussed with and agreed upon by the patient. ? ?

## 2022-04-23 ENCOUNTER — Other Ambulatory Visit: Payer: Self-pay | Admitting: Family Medicine

## 2022-04-23 DIAGNOSIS — Z1231 Encounter for screening mammogram for malignant neoplasm of breast: Secondary | ICD-10-CM

## 2022-05-16 ENCOUNTER — Ambulatory Visit
Admission: RE | Admit: 2022-05-16 | Discharge: 2022-05-16 | Disposition: A | Discharge status: Home / Self Care | Payer: Commercial Managed Care - HMO | Source: Ambulatory Visit | Attending: Family Medicine | Admitting: Family Medicine

## 2022-05-16 DIAGNOSIS — Z1231 Encounter for screening mammogram for malignant neoplasm of breast: Secondary | ICD-10-CM | POA: Diagnosis present

## 2023-12-25 ENCOUNTER — Other Ambulatory Visit: Payer: Self-pay | Admitting: Family Medicine

## 2023-12-25 DIAGNOSIS — Z1231 Encounter for screening mammogram for malignant neoplasm of breast: Secondary | ICD-10-CM

## 2024-01-01 ENCOUNTER — Ambulatory Visit
Admission: RE | Admit: 2024-01-01 | Discharge: 2024-01-01 | Disposition: A | Discharge status: Home / Self Care | Payer: Commercial Managed Care - HMO | Source: Ambulatory Visit | Attending: Family Medicine | Admitting: Family Medicine

## 2024-01-01 DIAGNOSIS — Z1231 Encounter for screening mammogram for malignant neoplasm of breast: Secondary | ICD-10-CM | POA: Insufficient documentation
